# Patient Record
Sex: Female | Born: 1960 | Race: White | Hispanic: No | Marital: Single | Smoking: Former smoker
Health system: Southern US, Community
[De-identification: ages and names within clinical notes are randomized; demographics above are authoritative.]

## PROBLEM LIST (undated history)

## (undated) DIAGNOSIS — G4700 Insomnia, unspecified: Secondary | ICD-10-CM

## (undated) DIAGNOSIS — Z9889 Other specified postprocedural states: Secondary | ICD-10-CM

## (undated) DIAGNOSIS — F319 Bipolar disorder, unspecified: Secondary | ICD-10-CM

## (undated) DIAGNOSIS — N951 Menopausal and female climacteric states: Secondary | ICD-10-CM

## (undated) DIAGNOSIS — T8859XA Other complications of anesthesia, initial encounter: Secondary | ICD-10-CM

## (undated) DIAGNOSIS — R232 Flushing: Secondary | ICD-10-CM

## (undated) DIAGNOSIS — R011 Cardiac murmur, unspecified: Secondary | ICD-10-CM

## (undated) DIAGNOSIS — Z973 Presence of spectacles and contact lenses: Secondary | ICD-10-CM

## (undated) DIAGNOSIS — E785 Hyperlipidemia, unspecified: Secondary | ICD-10-CM

## (undated) DIAGNOSIS — T7840XA Allergy, unspecified, initial encounter: Secondary | ICD-10-CM

## (undated) DIAGNOSIS — K219 Gastro-esophageal reflux disease without esophagitis: Secondary | ICD-10-CM

## (undated) DIAGNOSIS — T4145XA Adverse effect of unspecified anesthetic, initial encounter: Secondary | ICD-10-CM

## (undated) DIAGNOSIS — F419 Anxiety disorder, unspecified: Secondary | ICD-10-CM

## (undated) DIAGNOSIS — R112 Nausea with vomiting, unspecified: Secondary | ICD-10-CM

## (undated) HISTORY — DX: Presence of spectacles and contact lenses: Z97.3

## (undated) HISTORY — DX: Flushing: R23.2

## (undated) HISTORY — DX: Allergy, unspecified, initial encounter: T78.40XA

## (undated) HISTORY — DX: Menopausal and female climacteric states: N95.1

## (undated) HISTORY — DX: Bipolar disorder, unspecified: F31.9

## (undated) HISTORY — DX: Anxiety disorder, unspecified: F41.9

## (undated) HISTORY — PX: EYE SURGERY: SHX253

## (undated) HISTORY — DX: Gastro-esophageal reflux disease without esophagitis: K21.9

## (undated) HISTORY — DX: Hyperlipidemia, unspecified: E78.5

## (undated) HISTORY — DX: Cardiac murmur, unspecified: R01.1

## (undated) HISTORY — DX: Insomnia, unspecified: G47.00

---

## 2008-04-23 ENCOUNTER — Ambulatory Visit (HOSPITAL_BASED_OUTPATIENT_CLINIC_OR_DEPARTMENT_OTHER): Admission: RE | Admit: 2008-04-23 | Discharge: 2008-04-23 | Payer: Self-pay | Admitting: Ophthalmology

## 2010-08-02 ENCOUNTER — Ambulatory Visit: Payer: Self-pay

## 2010-10-20 NOTE — Op Note (Signed)
Katherine Diaz, Katherine Diaz                  ACCOUNT NO.:  [id number]   MEDICAL RECORD NO.:  [id number]          PATIENT TYPE:  AMB   LOCATION:  DSC                          FACILITY:  [facility]   PHYSICIAN:  Pasty Spillers. Maple Hudson, M.D. DATE OF BIRTH:  [birth year]   DATE OF PROCEDURE:  06/23/2008  DATE OF DISCHARGE:  04/23/2008                               OPERATIVE REPORT   PREOPERATIVE DIAGNOSES:  1. 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  2. Left dissociated horizontal deviation.  3. History of previous eye muscle surgery.   POSTOPERATIVE DIAGNOSES:  1. 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  2. Left dissociated horizontal deviation.  3. History of previous eye muscle surgery.   PROCEDURE:  Left lateral rectus muscle recession, 8.5 mm, adjustable  technique.   SURGEON:  Pasty Spillers. Young, MD   ANESTHESIA:  General (laryngeal mask).   COMPLICATIONS:  None.   DESCRIPTION OF PROCEDURE:  After routine preop evaluation including  informed consent, the patient was taken to the operating room where she  was identified by me.  General anesthesia was induced without difficulty  after placement of appropriate monitors.  The patient was prepped and  draped in standard sterile fashion.  Lid speculum was placed in each  eye, and forced ductions were carried out.  Forced ductions were found  to be positive to attempted elevation of the right eye.  The speculum  was placed in the left eye.   A traction suture of 6-0 silk was placed at the superior and inferior  limbus, this was used to draw the left eye nasally.  A limbal  conjunctival peritomy of 2 clock hours' extent was made temporally in  left eye with Westcott scissors, with relaxing incisions in the  supratemporal and inferotemporal quadrants.  Moderate scarring was  encountered from previous eye muscle surgery.  The left lateral rectus  muscle was identified and carefully cleared of its surrounding fascial  attachments  and scar tissue.  Its tendon was secured with a double-armed  6-0 Vicryl suture, with a double-locking bite at each border of the  muscle.  The muscle was disinserted.  The pole sutures were passed back  into the original muscle stump in crossed-swords fashion, and the muscle  was drawn up to the level of the original insertion.  The 2 pole sutures  were tied together approximately 10 cm above sclera.  The pole sutures  were joined with a needle driver at a measured distance of 8.5 mm above  sclera, with the muscle pulled up to the original insertion.  A noose  suture was tied around the pole sutures at this location, and the muscle  was allowed to hang back 8.5 mm, until the noose rested on sclera.  The  superior corner of the conjunctival flap was secured to the adjacent  sclera with a 6-0 plain gut suture, leaving conjunctiva recessed  approximately 7 mm.  The superotemporal relaxing incision was also  closed with a 6-0 plain gut suture.  The inferior corner of conjunctival  flap was very loosely joined to the adjacent conjunctiva with a  very  large loop of 6-0 plain gut, which was left long to facilitate access to  the muscle during suture adjustment.  The traction suture was  repositioned at the temporal limbus.  TobraDex ointment was placed in  the eye.  The pole, noose, and traction sutures were taped to the left  cheek.  A soft patch was placed on the eye.  The patient was awakened  without difficulty and taken to the recovery room in stable condition,  having suffered no intraoperative or immediate postoperative  complications.      Pasty Spillers. Maple Hudson, M.D.  Electronically Signed     WOY/MEDQ  D:  06/23/2008  T:  06/24/2008  Job:  [id number]

## 2011-03-08 IMAGING — MG MM DIGITAL SCREENING BILAT W/ CAD
1 series · 4 of 4 positions shown · non-contrast
Comparison: none

REASON FOR EXAM: annual
COMMENTS:

PROCEDURE:     MAM - MAM [REDACTED] DIG SCREEN MAM W/CAD  - August 02, 2010  [DATE]
RESULT:     There are no prior mammograms for comparison.
No dominant mass or malignant appearing microcalcifications are seen. There
are scattered fibroglandular densities bilaterally.

[R CC · right · 4 of 4 slices shown]
[im 1/4]
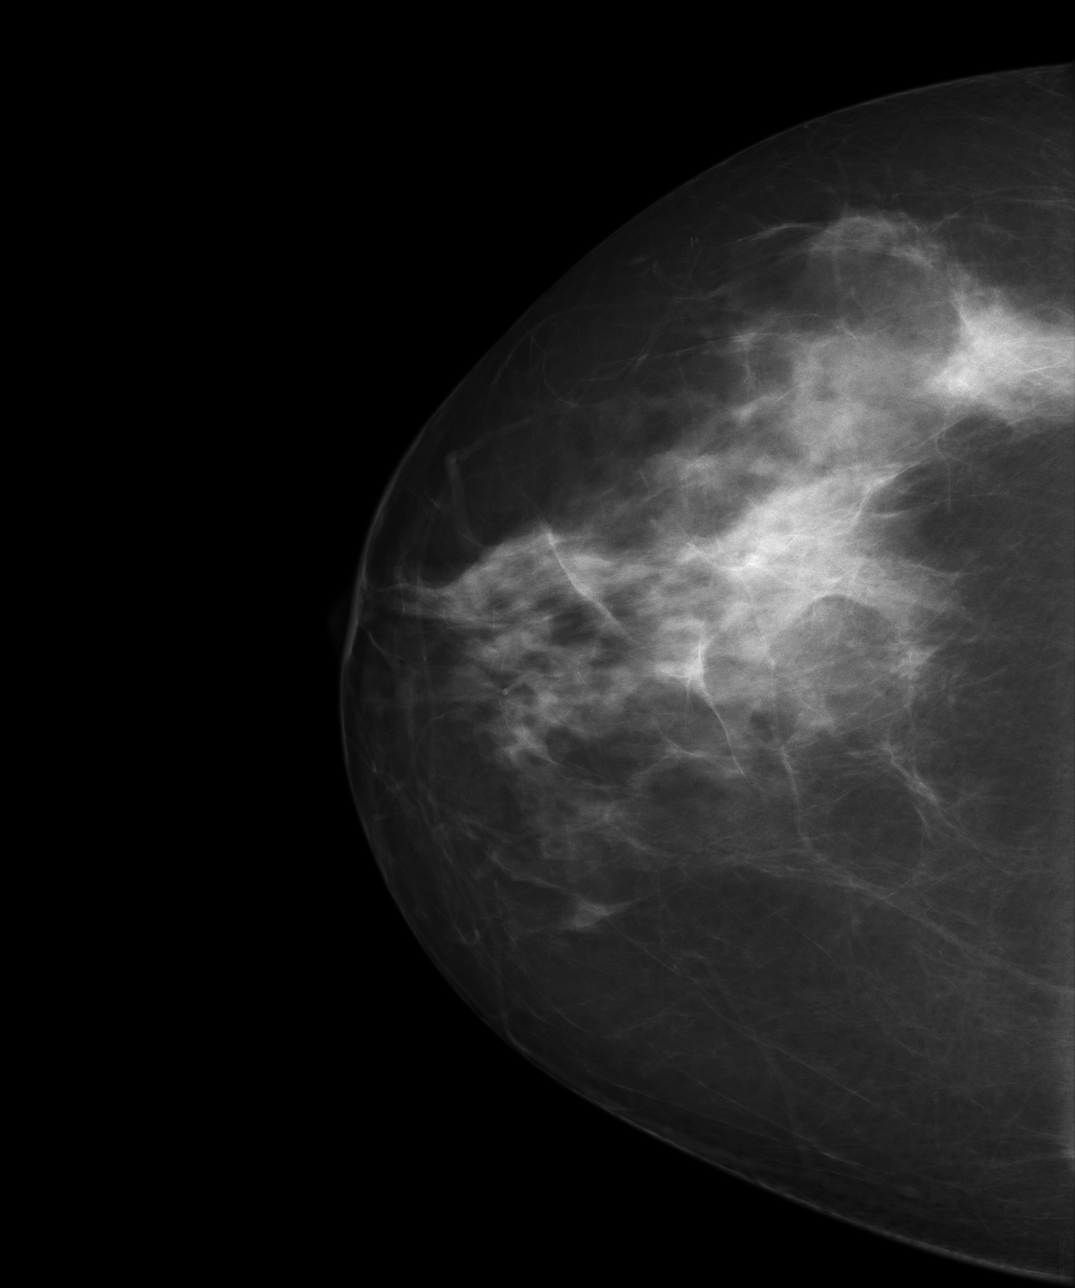
[im 2/4]
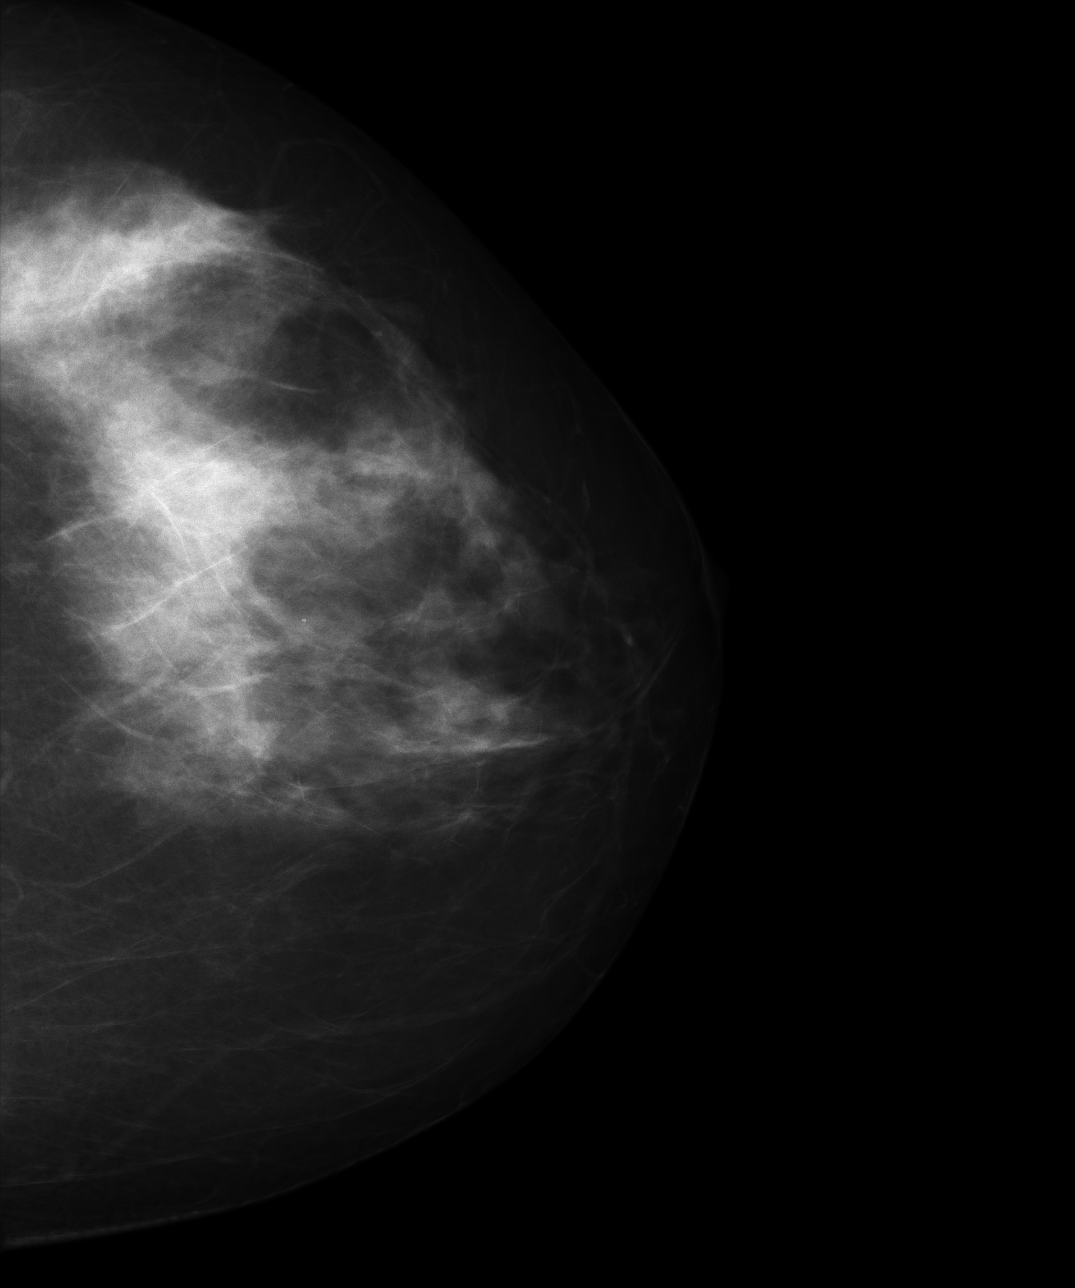
[im 3/4]
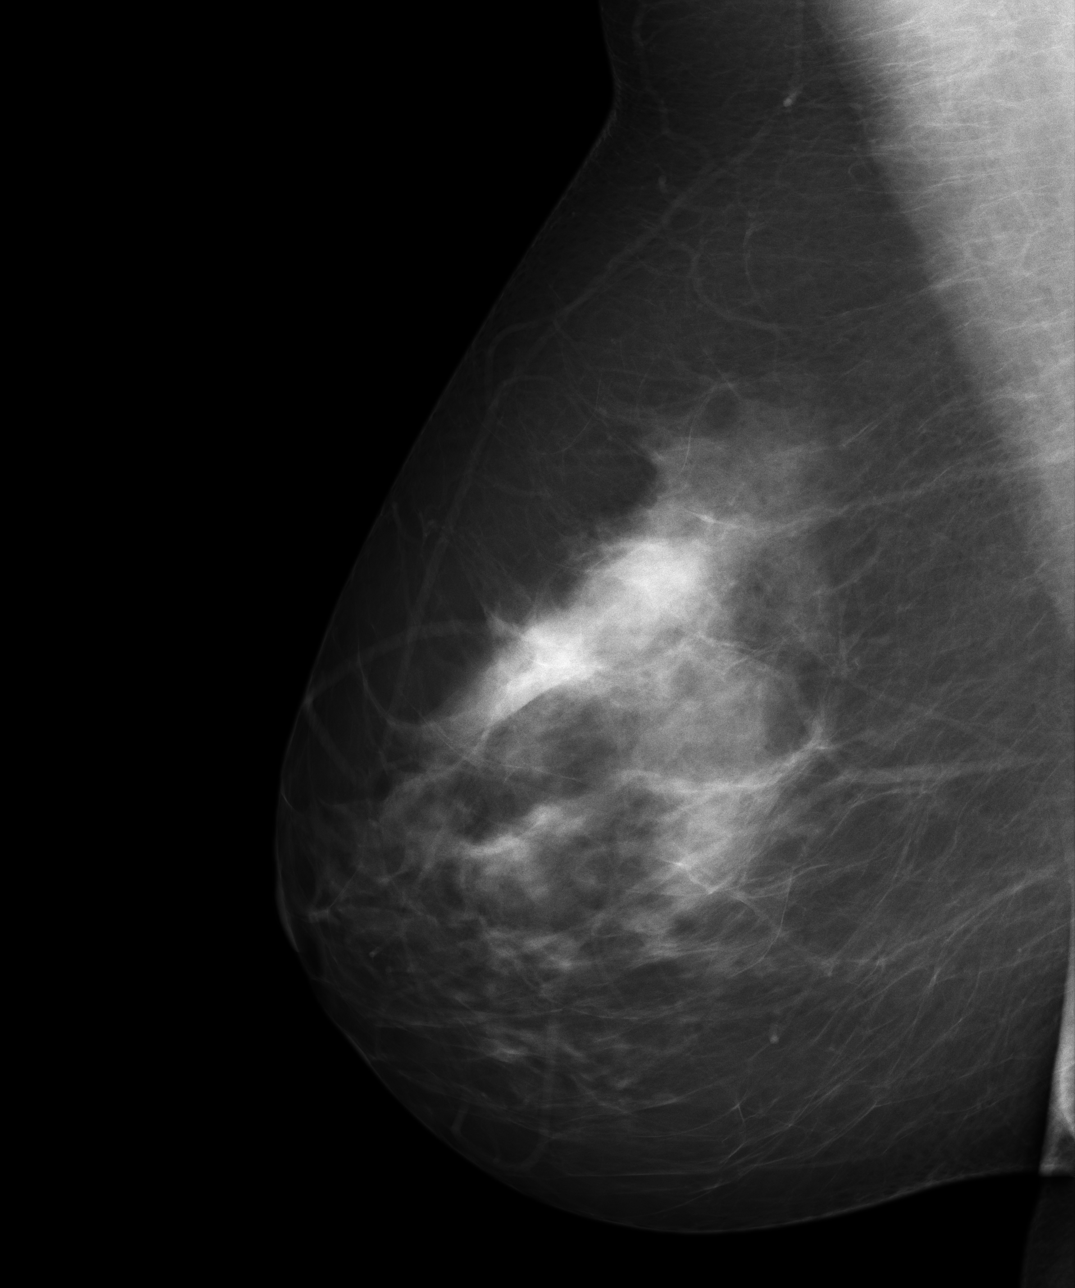
[im 4/4]
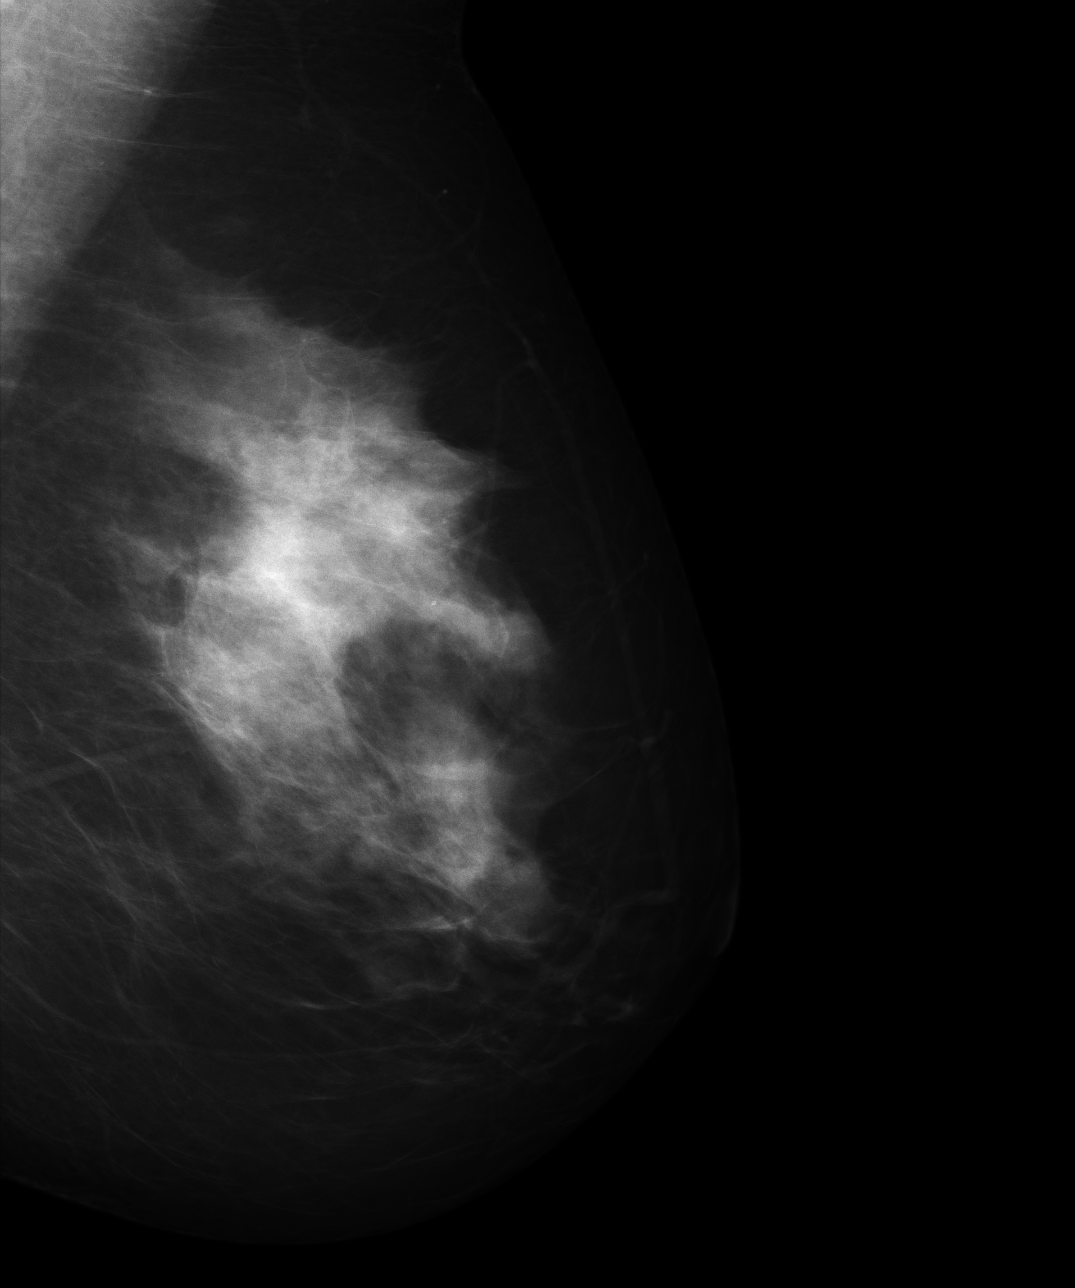

[4 of 4 positions shown; findings below may reference images not displayed]

IMPRESSION: 1.  Bilaterally benign appearing mammography.
2.  Annual mammography is recommended.
3.  BI-RADS: Category 1 Negative.

Thank you for this opportunity to contribute to the care of your patient.

A NEGATIVE MAMMOGRAM REPORT DOES NOT PRECLUDE BIOPSY OR OTHER EVALUATION OF
A CLINICALLY PALPABLE OR OTHERWISE SUSPICIOUS MASS OR LESION. BREAST CANCER
MAY NOT BE DETECTED BY MAMMOGRAPHY IN UP TO 10% OF CASES.

## 2014-05-10 ENCOUNTER — Emergency Department: Payer: Self-pay | Admitting: Emergency Medicine

## 2014-05-10 LAB — CBC WITH DIFFERENTIAL/PLATELET
Basophil #: 0 10*3/uL (ref 0.0–0.1)
Basophil %: 0.3 %
EOS ABS: 0.1 10*3/uL (ref 0.0–0.7)
Eosinophil %: 0.7 %
HCT: 42.3 % (ref 35.0–47.0)
HGB: 13.8 g/dL (ref 12.0–16.0)
LYMPHS ABS: 1.1 10*3/uL (ref 1.0–3.6)
Lymphocyte %: 8.2 %
MCH: 28.2 pg (ref 26.0–34.0)
MCHC: 32.7 g/dL (ref 32.0–36.0)
MCV: 86 fL (ref 80–100)
MONO ABS: 1 x10 3/mm — AB (ref 0.2–0.9)
Monocyte %: 7 %
Neutrophil #: 11.6 10*3/uL — ABNORMAL HIGH (ref 1.4–6.5)
Neutrophil %: 83.8 %
Platelet: 275 10*3/uL (ref 150–440)
RBC: 4.9 10*6/uL (ref 3.80–5.20)
RDW: 13.1 % (ref 11.5–14.5)
WBC: 13.9 10*3/uL — AB (ref 3.6–11.0)

## 2014-05-10 LAB — URINALYSIS, COMPLETE
BILIRUBIN, UR: NEGATIVE
BLOOD: NEGATIVE
Glucose,UR: NEGATIVE mg/dL (ref 0–75)
LEUKOCYTE ESTERASE: NEGATIVE
NITRITE: NEGATIVE
Ph: 5 (ref 4.5–8.0)
Protein: 30
RBC,UR: 20 /HPF (ref 0–5)
SPECIFIC GRAVITY: 1.029 (ref 1.003–1.030)
WBC UR: NONE SEEN /HPF (ref 0–5)

## 2014-05-10 LAB — COMPREHENSIVE METABOLIC PANEL
ALK PHOS: 65 U/L
ALT: 28 U/L
AST: 35 U/L (ref 15–37)
Albumin: 3.9 g/dL (ref 3.4–5.0)
Anion Gap: 9 (ref 7–16)
BILIRUBIN TOTAL: 0.4 mg/dL (ref 0.2–1.0)
BUN: 21 mg/dL — AB (ref 7–18)
CALCIUM: 8.9 mg/dL (ref 8.5–10.1)
CREATININE: 0.69 mg/dL (ref 0.60–1.30)
Chloride: 101 mmol/L (ref 98–107)
Co2: 28 mmol/L (ref 21–32)
EGFR (African American): >60
EGFR (Non-African Amer.): >60
Glucose: 100 mg/dL — ABNORMAL HIGH (ref 65–99)
OSMOLALITY: 279 (ref 275–301)
POTASSIUM: 3.4 mmol/L — AB (ref 3.5–5.1)
SODIUM: 138 mmol/L (ref 136–145)
TOTAL PROTEIN: 7.6 g/dL (ref 6.4–8.2)

## 2014-05-10 LAB — LIPASE, BLOOD: LIPASE: 96 U/L (ref 73–393)

## 2014-05-19 ENCOUNTER — Encounter (HOSPITAL_COMMUNITY): Payer: Self-pay | Admitting: Family Medicine

## 2014-05-19 ENCOUNTER — Emergency Department (HOSPITAL_COMMUNITY)
Admission: EM | Admit: 2014-05-19 | Discharge: 2014-05-19 | Disposition: A | Discharge status: Home / Self Care | Payer: BC Managed Care – PPO | Attending: Emergency Medicine | Admitting: Emergency Medicine

## 2014-05-19 DIAGNOSIS — Y9389 Activity, other specified: Secondary | ICD-10-CM | POA: Insufficient documentation

## 2014-05-19 DIAGNOSIS — Y998 Other external cause status: Secondary | ICD-10-CM | POA: Diagnosis not present

## 2014-05-19 DIAGNOSIS — S0003XA Contusion of scalp, initial encounter: Secondary | ICD-10-CM | POA: Insufficient documentation

## 2014-05-19 DIAGNOSIS — Z79899 Other long term (current) drug therapy: Secondary | ICD-10-CM | POA: Insufficient documentation

## 2014-05-19 DIAGNOSIS — S0990XA Unspecified injury of head, initial encounter: Secondary | ICD-10-CM

## 2014-05-19 DIAGNOSIS — Y92240 Courthouse as the place of occurrence of the external cause: Secondary | ICD-10-CM | POA: Insufficient documentation

## 2014-05-19 DIAGNOSIS — W01198A Fall on same level from slipping, tripping and stumbling with subsequent striking against other object, initial encounter: Secondary | ICD-10-CM | POA: Diagnosis not present

## 2014-05-19 MED ORDER — TRAMADOL HCL 50 MG PO TABS
50.0000 mg | ORAL_TABLET | Freq: Four times a day (QID) | ORAL | Status: DC | PRN
Start: 2014-05-19 — End: 2014-12-17

## 2014-05-19 MED ORDER — KETOROLAC TROMETHAMINE 30 MG/ML IJ SOLN
30.0000 mg | Freq: Once | INTRAMUSCULAR | Status: AC
  Administered 2014-05-19: 30 mg via INTRAVENOUS

## 2014-05-19 MED ORDER — KETOROLAC TROMETHAMINE 60 MG/2ML IM SOLN: 60.0000 mg | Freq: Once | INTRAMUSCULAR | Status: DC

## 2014-05-19 MED ORDER — KETOROLAC TROMETHAMINE 60 MG/2ML IM SOLN
30.0000 mg | Freq: Once | INTRAMUSCULAR | Status: DC
  Filled 2014-05-19: qty 2

## 2014-05-19 MED ORDER — NAPROXEN 500 MG PO TABS: 500.0000 mg | ORAL_TABLET | Freq: Two times a day (BID) | ORAL | Status: DC

## 2014-05-19 NOTE — ED Provider Notes (Signed)
CSN: [id number]     Arrival date & time 05/19/14  1119 History   First MD Initiated Contact with Patient 05/19/14 1330     Chief Complaint  Patient presents with  . Head Injury     (Consider location/radiation/quality/duration/timing/severity/associated sxs/prior Treatment) HPI Comments: 53 year old female, no significant past medical history presents after slipping on the steps [place] of [place] court house, falling and striking the back and top of her head. This was acute in onset, it occurred 3 hours prior to arrival, it was not associated with loss of consciousness, there has been no dizziness, lightheadedness, nausea, seizures, vomiting, blurred vision, numbness, weakness or difficulty with balance. She has had an ice pack to help with swelling, no medications, symptoms are consistent with only a headache. No prior head injury, no anticoagulation.  Patient is a 53 y.o. female presenting with head injury. The history is provided by the patient.  Head Injury   History reviewed. No pertinent past medical history. Past Surgical History  Procedure Laterality Date  . Cesarean section     History reviewed. No pertinent family history. History  Substance Use Topics  . Smoking status: Never Smoker   . Smokeless tobacco: Not on file  . Alcohol Use: No   OB History    No data available     Review of Systems  All other systems reviewed and are negative.     Allergies  Codeine  Home Medications   Prior to Admission medications   Medication Sig Start Date End Date Taking? Authorizing Provider  CRESTOR 10 MG tablet  05/11/14   Historical Provider, MD  LORazepam (ATIVAN) 1 MG tablet  03/01/14   Historical Provider, MD  naproxen (NAPROSYN) 500 MG tablet Take 1 tablet (500 mg total) by mouth 2 (two) times daily with a meal. 05/19/14   Vida RollerBrian D Sophy Mesler, MD  pantoprazole (PROTONIX) 40 MG tablet Take 40 mg by mouth daily. 04/24/14   Historical Provider, MD  promethazine (PHENERGAN) 12.5 MG  tablet  05/10/14   Historical Provider, MD  traMADol (ULTRAM) 50 MG tablet Take 1 tablet (50 mg total) by mouth every 6 (six) hours as needed. 05/19/14   Vida RollerBrian D Leo Weyandt, MD  traZODone (DESYREL) 50 MG tablet  05/18/14   Historical Provider, MD  venlafaxine (EFFEXOR) 75 MG tablet Take 75 mg by mouth daily. 03/14/14   Historical Provider, MD  venlafaxine XR (EFFEXOR-XR) 37.5 MG 24 hr capsule Take 37.5 mg by mouth daily. 03/15/14   Historical Provider, MD   BP 131/76 mmHg  Pulse 75  Temp(Src) 98.3 F (36.8 C)  Resp 18  Ht 5\' 7"  (1.702 m)  Wt 175 lb (79.379 kg)  BMI 27.40 kg/m2  SpO2 97% Physical Exam  Constitutional: She appears well-developed and well-nourished. No distress.  HENT:  Head: Normocephalic.  Mouth/Throat: Oropharynx is clear and moist. No oropharyngeal exudate.  Hematoma to the posterior occiput at the crown of the head, no hemotympanum, no malocclusion, no battle sign, no raccoon eyes  Eyes: Conjunctivae and EOM are normal. Pupils are equal, round, and reactive to light. Right eye exhibits no discharge. Left eye exhibits no discharge. No scleral icterus.  Neck: Normal range of motion. Neck supple. No JVD present. No thyromegaly present.  Cardiovascular: Normal rate, regular rhythm, normal heart sounds and intact distal pulses.  Exam reveals no gallop and no friction rub.   No murmur heard. Pulmonary/Chest: Effort normal and breath sounds normal. No respiratory distress. She has no wheezes. She has no rales.  Abdominal: Soft. Bowel sounds are normal. She exhibits no distension and no mass. There is no tenderness.  Musculoskeletal: Normal range of motion. She exhibits no edema or tenderness.  Lymphadenopathy:    She has no cervical adenopathy.  Neurological: She is alert. Coordination normal.  Speech is clear, cranial nerves III through XII are intact, memory is intact, strength is normal in all 4 extremities including grips, sensation is intact to light touch and pinprick in  all 4 extremities. Coordination as tested by finger-nose-finger is normal, no limb ataxia. Normal gait, normal reflexes at the patellar tendons bilaterally  Skin: Skin is warm and dry. No rash noted. No erythema.  Psychiatric: She has a normal mood and affect. Her behavior is normal.  Nursing note and vitals reviewed.   ED Course  Procedures (including critical care time) Labs Review Labs Reviewed - No data to display  Imaging Review No results found.   MDM   Final diagnoses:  Head injury  Hematoma of scalp, initial encounter    No other signs of injury, isolated hematoma, patient has a normal mental status and neurologic exam, no nausea, 3 hours out from accident, she declines CT scan, requests anti-inflammatory medication and states that she has to get back to work. Will prescribe medications as below.  She is aware of the indications for return   Meds given in ED:  Medications  ketorolac (TORADOL) injection 60 mg (not administered)    New Prescriptions   NAPROXEN (NAPROSYN) 500 MG TABLET    Take 1 tablet (500 mg total) by mouth 2 (two) times daily with a meal.   TRAMADOL (ULTRAM) 50 MG TABLET    Take 1 tablet (50 mg total) by mouth every 6 (six) hours as needed.        Vida RollerBrian D Newton Frutiger, MD 05/19/14 [phone]

## 2014-05-19 NOTE — Discharge Instructions (Signed)
Please call your doctor for a followup appointment within 24-48 hours. When you talk to your doctor please let them know that you were seen in the emergency department and have them acquire all of your records so that they can discuss the findings with you and formulate a treatment plan to fully care for your new and ongoing problems. ° °

## 2014-05-19 NOTE — ED Notes (Signed)
Went into room introduced myself and instructed the pt that we needed her to place on a gown so she can be placed on a monitor and the pt stated "No I'm not taking off my clothes. Not putting on a gown or anything else. I don't need it, all I have is a bump on my head and that's it"; Ed, RN and Hyacinth MeekerMiller, MD going to speak with pt now

## 2014-05-19 NOTE — ED Notes (Signed)
Per pt sts she tripped and fell on the stairs [place] the court house. sts that she hit head and is having head pain, nausea and hematoma to the back of head. Denies blood thinners.

## 2014-05-19 NOTE — ED Notes (Signed)
Pt brought to room from triage

## 2014-12-05 ENCOUNTER — Other Ambulatory Visit: Payer: Self-pay | Admitting: Family Medicine

## 2014-12-17 ENCOUNTER — Encounter: Payer: Self-pay | Admitting: Family Medicine

## 2014-12-17 ENCOUNTER — Encounter (INDEPENDENT_AMBULATORY_CARE_PROVIDER_SITE_OTHER): Payer: Self-pay

## 2014-12-17 ENCOUNTER — Ambulatory Visit (INDEPENDENT_AMBULATORY_CARE_PROVIDER_SITE_OTHER): Payer: BLUE CROSS/BLUE SHIELD | Admitting: Family Medicine

## 2014-12-17 VITALS — BP 126/74 | HR 68 | Temp 98.0°F | Resp 16 | Ht 65.0 in | Wt 185.7 lb

## 2014-12-17 DIAGNOSIS — R5383 Other fatigue: Secondary | ICD-10-CM | POA: Insufficient documentation

## 2014-12-17 DIAGNOSIS — N951 Menopausal and female climacteric states: Secondary | ICD-10-CM | POA: Insufficient documentation

## 2014-12-17 DIAGNOSIS — F411 Generalized anxiety disorder: Secondary | ICD-10-CM

## 2014-12-17 DIAGNOSIS — E785 Hyperlipidemia, unspecified: Secondary | ICD-10-CM

## 2014-12-17 DIAGNOSIS — G4700 Insomnia, unspecified: Secondary | ICD-10-CM | POA: Insufficient documentation

## 2014-12-17 DIAGNOSIS — K219 Gastro-esophageal reflux disease without esophagitis: Secondary | ICD-10-CM | POA: Insufficient documentation

## 2014-12-17 DIAGNOSIS — F3160 Bipolar disorder, current episode mixed, unspecified: Secondary | ICD-10-CM | POA: Insufficient documentation

## 2014-12-17 DIAGNOSIS — L720 Epidermal cyst: Secondary | ICD-10-CM | POA: Insufficient documentation

## 2014-12-17 MED ORDER — CLONAZEPAM 1 MG PO TABS: 1.0000 mg | ORAL_TABLET | Freq: Two times a day (BID) | ORAL | Status: DC | PRN

## 2014-12-17 NOTE — Progress Notes (Signed)
Name: Katherine Diaz   MRN: [id number]    DOB: 13-Sep-1960   Date:12/17/2014       Progress Note  Subjective  Chief Complaint  Chief Complaint  Patient presents with  . Labs Only    to check cholesterol. patient is interested in doing the colorguard.  . Anxiety  . Medication Refill    HPI  Anxiety: Patient complains of anxiety disorder, panic attacks and sleep disturbance.  She has the following symptoms: difficulty concentrating, fatigue, feelings of losing control, insomnia, irritable, palpitations, psychomotor agitation, racing thoughts. Onset of symptoms was approximately several years ago, stable since that time. She denies current suicidal and homicidal ideation. Family history significant for anxiety and depression.Possible organic causes contributing are: medications, endocrine/metabolic. Risk factors: previous episode of depression Previous treatment includes Venlafaxine, benzodiazepines Ativan, Xanax, more recently clonazepam and medication.  She complains of the following side effects from the treatment: none. Her anxiety is not well controled and she has made an appointment with a psychiatrist but it will not be until Dec 2016.   Hyperlipidemia: Patient presents with hyperlipidemia.  She was tested because she has had previously elevated cholesterol. She denies cardiac symptoms such as chest pain, shortness of breath with rest or exertions, palpitations, dizziness, claudication. There is a family history of hyperlipidemia. There is not a family history of early ischemia heart disease. She has previously been tried on a few types of statins such as Crestor  and Atorvastatin  but everything caused arthralgia and myalgia and memory changes.        Patient Active Problem List   Diagnosis Date Noted  . Anxiety disorder 12/17/2014  . Gastro-esophageal reflux disease without esophagitis 12/17/2014  . Hot flash, menopausal 12/17/2014  . Hyperlipidemia LDL goal <100 12/17/2014   . Insomnia, uncontrolled 12/17/2014  . Atheroma, skin 12/17/2014    History  Substance Use Topics  . Smoking status: Former Games developer  . Smokeless tobacco: Not on file  . Alcohol Use: No     Current outpatient prescriptions:  .  clonazePAM (KLONOPIN) 1 MG tablet, Take by mouth., Disp: , Rfl:  .  MIMVEY 1-0.5 MG per tablet, TAKE 1 TABLET BY MOUTH EVERY DAY FOR 28 DAYS, Disp: , Rfl: 2 .  pantoprazole (PROTONIX) 40 MG tablet, Take by mouth., Disp: , Rfl:  .  traZODone (DESYREL) 50 MG tablet, Take by mouth., Disp: , Rfl:  .  venlafaxine (EFFEXOR) 75 MG tablet, Take 75 mg by mouth daily., Disp: , Rfl: 2 .  venlafaxine XR (EFFEXOR-XR) 37.5 MG 24 hr capsule, Take 37.5 mg by mouth daily., Disp: , Rfl: 3  Past Surgical History  Procedure Laterality Date  . Cesarean section    . Eye surgery      amblyopia    Family History  Problem Relation Age of Onset  . Heart disease Paternal Grandmother   . Hypertension Paternal Grandmother     Allergies  Allergen Reactions  . Atorvastatin   . Codeine Nausea And Vomiting     Review of Systems  CONSTITUTIONAL: No significant weight changes, fever, chills, weakness or fatigue.  HEENT:  - Eyes: No visual changes.  - Ears: No auditory changes. No pain.  - Nose: No sneezing, congestion, runny nose. - Throat: No sore throat. No changes in swallowing. SKIN: No rash or itching.  CARDIOVASCULAR: No chest pain, chest pressure or chest discomfort. No palpitations or edema.  RESPIRATORY: No shortness of breath, cough or sputum.  GASTROINTESTINAL: No anorexia, nausea, vomiting. No  changes in bowel habits. No abdominal pain or blood.  GENITOURINARY: No dysuria. No frequency. No discharge. NEUROLOGICAL: No headache, dizziness, syncope, paralysis, ataxia, numbness or tingling in the extremities. No memory changes. No change in bowel or bladder control.  MUSCULOSKELETAL: No joint pain. No muscle pain. HEMATOLOGIC: No anemia, bleeding or bruising.   LYMPHATICS: No enlarged lymph nodes.  PSYCHIATRIC: No change in mood. No change in sleep pattern.  ENDOCRINOLOGIC: No reports of sweating, cold or heat intolerance. No polyuria or polydipsia.      Objective  BP 126/74 mmHg  Pulse 68  Temp(Src) 98 F (36.7 C) (Oral)  Resp 16  Ht 5\' 5"  (1.651 m)  Wt 185 lb 11.2 oz (84.233 kg)  BMI 30.90 kg/m2  SpO2 97% Body mass index is 30.9 kg/(m^2).  Physical Exam  Constitutional: Patient is obese and well-nourished. In no distress.  HEENT:  - Head: Normocephalic and atraumatic.  - Ears: Bilateral TMs gray, no erythema or effusion - Nose: Nasal mucosa moist - Mouth/Throat: Oropharynx is clear and moist. No tonsillar hypertrophy or erythema. No post nasal drainage.  - Eyes: Conjunctivae clear, EOM movements normal. PERRLA. No scleral icterus.  Neck: Normal range of motion. Neck supple. No JVD present. No thyromegaly present.  Cardiovascular: Normal rate, regular rhythm and normal heart sounds.  No murmur heard.  Pulmonary/Chest: Effort normal and breath sounds normal. No respiratory distress. Musculoskeletal: Normal range of motion bilateral UE and LE, no joint effusions. Peripheral vascular: Bilateral LE no edema. Neurological: CN II-XII grossly intact with no focal deficits. Alert and oriented to person, place, and time. Coordination, balance, strength, speech and gait are normal.  Skin: Skin is warm and dry. No rash noted. No erythema.  Psychiatric: Patient has a stable mood and affect. Behavior is normal in office today. Judgment and thought content normal in office today.   Assessment & Plan  1. Generalized anxiety disorder Continue Venlafaxine total 112mg  XR po qday and clonazepam 1mg  po up to twice a day. She was not interested in increasing Venlafaxine dose as she felt that it worsened her depression symptoms. Ms Katherine Diaz will establish with the psychiatry specialist in December.  - clonazePAM (KLONOPIN) 1 MG tablet; Take 1 tablet  (1 mg total) by mouth 2 (two) times daily as needed for anxiety.  Dispense: 60 tablet; Refill: 5 - TSH  2. Hyperlipidemia LDL goal <100 Due to recheck labs. Consider Vascepa, Tricor, Vytorin as alternative options.  - Lipid panel - TSH  3. Other fatigue Lab work.  - CBC with Differential/Platelet - Comprehensive metabolic panel - TSH

## 2014-12-20 ENCOUNTER — Other Ambulatory Visit: Payer: Self-pay

## 2014-12-20 DIAGNOSIS — G4700 Insomnia, unspecified: Secondary | ICD-10-CM

## 2014-12-20 MED ORDER — TRAZODONE HCL 50 MG PO TABS: 50.0000 mg | ORAL_TABLET | Freq: Every day | ORAL | Status: DC

## 2014-12-20 NOTE — Telephone Encounter (Signed)
Refill request was sent to Dr. Ashany Sundaram for approval and submission.  

## 2014-12-24 ENCOUNTER — Other Ambulatory Visit: Payer: Self-pay | Admitting: Family Medicine

## 2014-12-24 DIAGNOSIS — G4700 Insomnia, unspecified: Secondary | ICD-10-CM

## 2014-12-24 MED ORDER — TRAZODONE HCL 150 MG PO TABS: 150.0000 mg | ORAL_TABLET | Freq: Every day | ORAL | Status: DC

## 2014-12-25 LAB — COMPREHENSIVE METABOLIC PANEL
A/G RATIO: 1.8 (ref 1.1–2.5)
ALBUMIN: 4.2 g/dL (ref 3.5–5.5)
ALK PHOS: 57 IU/L (ref 39–117)
ALT: 15 IU/L (ref 0–32)
AST: 14 IU/L (ref 0–40)
BILIRUBIN TOTAL: 0.2 mg/dL (ref 0.0–1.2)
BUN / CREAT RATIO: 21 (ref 9–23)
BUN: 13 mg/dL (ref 6–24)
CO2: 25 mmol/L (ref 18–29)
CREATININE: 0.63 mg/dL (ref 0.57–1.00)
Calcium: 9.4 mg/dL (ref 8.7–10.2)
Chloride: 98 mmol/L (ref 97–108)
GFR, EST AFRICAN AMERICAN: 119 mL/min/{1.73_m2} (ref >59)
GFR, EST NON AFRICAN AMERICAN: 103 mL/min/{1.73_m2} (ref >59)
GLOBULIN, TOTAL: 2.3 g/dL (ref 1.5–4.5)
Glucose: 94 mg/dL (ref 65–99)
Potassium: 4.4 mmol/L (ref 3.5–5.2)
Sodium: 138 mmol/L (ref 134–144)
TOTAL PROTEIN: 6.5 g/dL (ref 6.0–8.5)

## 2014-12-25 LAB — CBC WITH DIFFERENTIAL/PLATELET
Basophils Absolute: 0 10*3/uL (ref 0.0–0.2)
Basos: 0 %
EOS (ABSOLUTE): 0.1 10*3/uL (ref 0.0–0.4)
EOS: 1 %
HEMATOCRIT: 38.3 % (ref 34.0–46.6)
Hemoglobin: 12.6 g/dL (ref 11.1–15.9)
IMMATURE GRANULOCYTES: 0 %
Immature Grans (Abs): 0 10*3/uL (ref 0.0–0.1)
LYMPHS ABS: 2.4 10*3/uL (ref 0.7–3.1)
LYMPHS: 31 %
MCH: 28.3 pg (ref 26.6–33.0)
MCHC: 32.9 g/dL (ref 31.5–35.7)
MCV: 86 fL (ref 79–97)
MONOCYTES: 7 %
Monocytes Absolute: 0.5 10*3/uL (ref 0.1–0.9)
NEUTROS ABS: 4.7 10*3/uL (ref 1.4–7.0)
Neutrophils: 61 %
PLATELETS: 282 10*3/uL (ref 150–379)
RBC: 4.45 x10E6/uL (ref 3.77–5.28)
RDW: 13 % (ref 12.3–15.4)
WBC: 7.8 10*3/uL (ref 3.4–10.8)

## 2014-12-25 LAB — LIPID PANEL
CHOL/HDL RATIO: 4.1 ratio (ref 0.0–4.4)
CHOLESTEROL TOTAL: 253 mg/dL — AB (ref 100–199)
HDL: 61 mg/dL (ref >39)
LDL CALC: 171 mg/dL — AB (ref 0–99)
TRIGLYCERIDES: 104 mg/dL (ref 0–149)
VLDL CHOLESTEROL CAL: 21 mg/dL (ref 5–40)

## 2014-12-25 LAB — TSH: TSH: 0.664 u[IU]/mL (ref 0.450–4.500)

## 2014-12-27 ENCOUNTER — Other Ambulatory Visit: Payer: Self-pay

## 2014-12-27 DIAGNOSIS — E785 Hyperlipidemia, unspecified: Secondary | ICD-10-CM

## 2014-12-27 MED ORDER — PITAVASTATIN CALCIUM 2 MG PO TABS: 2.0000 mg | ORAL_TABLET | Freq: Every day | ORAL | Status: DC

## 2014-12-27 NOTE — Telephone Encounter (Signed)
Per the request of Dr. Sherley Bounds and the permission of this patient a new rx was sent to [facility] on [address] for her to try that will hopefully lower her cholesterol. Patient was informed.

## 2015-01-11 LAB — HM MAMMOGRAPHY

## 2015-01-13 ENCOUNTER — Telehealth: Payer: Self-pay | Admitting: Family Medicine

## 2015-01-13 NOTE — Telephone Encounter (Signed)
I have been aware of her previous intolerance to statin medications to help lower her cholesterol. That is why on 12/29/14 I prescribed Livalo a new medication designed to help patients who get muscle aches and pains and fatigue with typical statin medications. But if this medication is also causing her to feel this way there she is welcome to try taking the medication every other day to see if it helps reduce her symptoms. If not she can stop using it and will have to control her diet as best as possible with diet and exercise as I am not aware of any other prescription options at this time.

## 2015-01-13 NOTE — Telephone Encounter (Signed)
Pt said that she can not take her cholesterol meds. It makes her feel like she has been run over by a train. She is in pain and had to take Motrain 2 times yesterday for the pain. She has tried several different ones and she can not take any of them and needs to know what to do.

## 2015-01-13 NOTE — Telephone Encounter (Signed)
Patient was informed of the message below and stated, "ok, I just feel like an old lady when I take them." Patient was encouraged to do as directed with medication, diet and exercise and hopefully there will be a decrease with her cholesterol and she will not have to continue to taking the medication. She said ok and thanks.

## 2015-02-15 ENCOUNTER — Ambulatory Visit (INDEPENDENT_AMBULATORY_CARE_PROVIDER_SITE_OTHER): Payer: BLUE CROSS/BLUE SHIELD | Admitting: Family Medicine

## 2015-02-15 ENCOUNTER — Encounter: Payer: Self-pay | Admitting: Family Medicine

## 2015-02-15 VITALS — BP 112/72 | HR 89 | Temp 98.6°F | Resp 16 | Wt 184.9 lb

## 2015-02-15 DIAGNOSIS — G44009 Cluster headache syndrome, unspecified, not intractable: Secondary | ICD-10-CM

## 2015-02-15 DIAGNOSIS — F411 Generalized anxiety disorder: Secondary | ICD-10-CM

## 2015-02-15 DIAGNOSIS — J45909 Unspecified asthma, uncomplicated: Secondary | ICD-10-CM | POA: Diagnosis not present

## 2015-02-15 DIAGNOSIS — N951 Menopausal and female climacteric states: Secondary | ICD-10-CM

## 2015-02-15 DIAGNOSIS — R0982 Postnasal drip: Secondary | ICD-10-CM

## 2015-02-15 DIAGNOSIS — J309 Allergic rhinitis, unspecified: Secondary | ICD-10-CM

## 2015-02-15 MED ORDER — SUMATRIPTAN SUCCINATE 100 MG PO TABS: ORAL_TABLET | ORAL | Status: DC

## 2015-02-15 MED ORDER — PREDNISONE 10 MG PO TABS: 10.0000 mg | ORAL_TABLET | Freq: Two times a day (BID) | ORAL | Status: DC

## 2015-02-15 NOTE — Patient Instructions (Signed)
Cluster Headache Cluster headaches are recognized by their pattern of deep, intense head pain. They normally occur on one side of your head, but they may "switch sides" in subsequent episodes. Typically, cluster headaches:   Are severe in nature.   Occur repeatedly over weeks to months and are followed by periods of no headaches.   Can last from 15 minutes to 3 hours.   Occur at the same time each day, often at night.   Occur several times a day. CAUSES The exact cause of cluster headaches is not known. Alcohol use may be associated with cluster headaches. SIGNS AND SYMPTOMS   Severe pain that begins in or around your eye or temple.   One-sided head pain.   Feeling sick to your stomach (nauseous).   Sensitivity to light.   Runny nose.   Eye redness, tearing, and nasal stuffiness on the side of your head where you are experiencing pain.   Sweaty, pale skin of the face.   Droopy or swollen eyelid.   Restlessness. DIAGNOSIS  Cluster headaches are diagnosed based on symptoms and a physical exam. Your health care provider may order a CT scan or an MRI of your head or lab tests to see if your headaches are caused by other medical conditions.  TREATMENT   Medicines for pain relief and to prevent recurrent attacks. Some people may need a combination of medicines.  Oxygen for pain relief.   Biofeedback programs to help reduce headache pain.  It may be helpful to keep a headache diary. This may help you find a trend for what is triggering your headaches. Your health care provider can develop a treatment plan.  HOME CARE INSTRUCTIONS  During cluster periods:   Follow a regular sleep schedule. Do not vary the amount and time that you sleep from day to day. It is important to stay on the same schedule during a cluster period to help prevent headaches.   Avoid alcohol.   Stop smoking if you smoke.  SEEK MEDICAL CARE IF:  You have any changes from your previous  cluster headaches either in intensity or frequency.   You are not getting relief from medicines you are taking.  SEEK IMMEDIATE MEDICAL CARE IF:   You faint.   You have weakness or numbness, especially on one side of your body or face.   You have double vision.   You have nausea or vomiting that is not relieved within several hours.   You cannot keep your balance or have difficulty talking or walking.   You have neck pain or stiffness.   You have a fever. MAKE SURE YOU:  Understand these instructions.   Will watch your condition.   Will get help right away if you are not doing well or get worse. Document Released: 05/21/2005 Document Revised: 03/11/2013 Document Reviewed: 12/11/2012 [employer] Patient Information 2015 [employer]. This information is not intended to replace advice given to you by your health care provider. Make sure you discuss any questions you have with your health care provider.  

## 2015-02-15 NOTE — Progress Notes (Signed)
Name: Katherine Diaz   MRN: [id number]    DOB: 05-06-1961   Date:02/15/2015       Progress Note  Subjective  Chief Complaint  Chief Complaint  Patient presents with  . Migraine    Patient has been having some headaches that come from 2:30-6:30am. When it happens she has sensitivity to light and noise. Patient stated that it happens for 3-4 days then it goes away and comes back for about 2 days.    HPI  Headache: Patient presents today to discuss recent headaches. Symptoms began about a few months ago. Generally, the headaches last about 1-3 hrs and occur nightly from 2 am to 6 am sudden onset for about 3-4 nights in a row then nothing for 2-3 nights and the cycle starts again. The headaches are usually sharp, squeezing and throbbing and are frontal in location, almost as if they are behind her face or eyes.  The patient rates her most severe headaches a 10 on a scale from 1 to 10. Recently, the headaches are increasing in both severity and frequency. Work attendance or other daily activities are not affected by the headaches as they do not occur during the day. Precipitating factors may include caffeine withdrawal, stress, URI symptoms, sinus congestion and weather changes. The headaches are usually not preceded by an aura. Associated neurologic symptoms which are present include: depression and anxiety. The patient denies dizziness, loss of balance, muscle weakness, numbness of extremities, speech difficulties, vision problems and vomiting in the early morning. Other associated symptoms include: fatigue and irritability.  Symptoms which are not present include: chest pain, conjunctivitis, cough, earache, fever, rash, sore throat, vomiting and wheezing. Home treatment has included acetaminophen and ibuprofen, darkening the room, resting and sleeping with adequate improvement but she is concerned about the re-occurance. Other history includes: tension and sinus headaches diagnosed in the past. Family  history includes migraine headaches in parents.  Anxiety: Patient complains of anxiety disorder, panic attacks and sleep disturbance. She has the following symptoms: difficulty concentrating, fatigue, feelings of losing control, insomnia, irritable, palpitations, psychomotor agitation, racing thoughts. Onset of symptoms was approximately several years ago, stable since that time but recently feeling like her symptoms are not well controlled. 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 Family history significant for anxiety and depression.Possible organic causes contributing are: medications, endocrine/metabolic. Risk factors: previous episode of depression.  Previous treatment includes Venlafaxine, benzodiazepines Ativan, Xanax, more recently clonazepam and medication. She complains of the following side effects from the treatment: none. Her anxiety is not well controled and she has made an appointment with a psychiatrist but it will not be until Dec 2016. She is asking me to refer her to someone sooner if possible.    Patient Active Problem List   Diagnosis Date Noted  . Anxiety disorder 12/17/2014  . Gastro-esophageal reflux disease without esophagitis 12/17/2014  . Hot flash, menopausal 12/17/2014  . Hyperlipidemia LDL goal <100 12/17/2014  . Insomnia, uncontrolled 12/17/2014  . Atheroma, skin 12/17/2014  . Other fatigue 12/17/2014    Social History  Substance Use Topics  . Smoking status: Former Games developer  . Smokeless tobacco: Not on file  . Alcohol Use: No     Current outpatient prescriptions:  .  clonazePAM (KLONOPIN) 1 MG tablet, Take 1 tablet (1 mg total) by mouth 2 (two) times daily as needed for anxiety., Disp: 60 tablet, Rfl: 5 .  MIMVEY 1-0.5 MG per tablet, TAKE 1 TABLET BY  MOUTH EVERY DAY FOR 28 DAYS, Disp: , Rfl: 2 .  pantoprazole (PROTONIX) 40 MG tablet, Take by mouth., Disp: , Rfl:  .  Pitavastatin Calcium (LIVALO) 2 MG TABS, Take 1  tablet (2 mg total) by mouth at bedtime., Disp: 30 tablet, Rfl: 3 .  traZODone (DESYREL) 150 MG tablet, Take 1 tablet (150 mg total) by mouth at bedtime., Disp: 90 tablet, Rfl: 3 .  venlafaxine (EFFEXOR) 75 MG tablet, Take 75 mg by mouth daily., Disp: , Rfl: 2 .  venlafaxine XR (EFFEXOR-XR) 37.5 MG 24 hr capsule, Take 37.5 mg by mouth daily., Disp: , Rfl: 3  Past Surgical History  Procedure Laterality Date  . Cesarean section    . Eye surgery      amblyopia    Family History  Problem Relation Age of Onset  . Heart disease Paternal Grandmother   . Hypertension Paternal Grandmother     Allergies  Allergen Reactions  . Atorvastatin   . Codeine Nausea And Vomiting     Review of Systems  CONSTITUTIONAL: No significant weight changes, fever, chills, weakness or fatigue.  HEENT:  - Eyes: No visual changes.  - Ears: No auditory changes. No pain.  - Nose: No sneezing, congestion, runny nose. - Throat: No sore throat. No changes in swallowing. SKIN: No rash or itching.  CARDIOVASCULAR: No chest pain, chest pressure or chest discomfort. No palpitations or edema.  RESPIRATORY: No shortness of breath, cough or sputum.  GASTROINTESTINAL: No anorexia, nausea, vomiting. No changes in bowel habits. No abdominal pain or blood.  GENITOURINARY: No dysuria. No frequency. No discharge. NEUROLOGICAL: Yes headache. No dizziness, syncope, paralysis, ataxia, numbness or tingling in the extremities. No memory changes. No change in bowel or bladder control.  MUSCULOSKELETAL: No joint pain. No muscle pain. HEMATOLOGIC: No anemia, bleeding or bruising.  LYMPHATICS: No enlarged lymph nodes.  PSYCHIATRIC: Yes change in mood. No change in sleep pattern.  ENDOCRINOLOGIC: No reports of sweating, cold or heat intolerance. No polyuria or polydipsia.     Objective  BP 112/72 mmHg  Pulse 89  Temp(Src) 98.6 F (37 C) (Oral)  Resp 16  Wt 184 lb 14.4 oz (83.87 kg)  SpO2 96% Body mass index is 30.77  kg/(m^2).  Physical Exam  Constitutional: Patient is overweight and well-nourished. In no distress.  HEENT:  - Head: Normocephalic and atraumatic.  - Ears: Bilateral TMs gray, no erythema or effusion - Nose: Nasal mucosa moist, boggy and congested. - Mouth/Throat: Oropharynx is clear and moist. No tonsillar hypertrophy or erythema. Some post nasal drainage.  - Eyes: Conjunctivae clear, EOM movements normal. PERRLA. No scleral icterus. Fundoscopic exam benign.  Neck: Normal range of motion. Neck supple. No JVD present. No thyromegaly present.  Cardiovascular: Normal rate, regular rhythm and normal heart sounds.  No murmur heard.  Pulmonary/Chest: Effort normal and breath sounds normal. No respiratory distress. Musculoskeletal: Normal range of motion bilateral UE and LE, no joint effusions. Peripheral vascular: Bilateral LE no edema. Neurological: CN II-XII grossly intact with no focal deficits. Alert and oriented to person, place, and time. Coordination, balance, strength, speech and gait are normal.  Skin: Skin is warm and dry. No rash noted. No erythema.  Psychiatric: Patient has a stable mood and affect. Behavior is normal in office today. Judgment and thought content normal in office today.   Recent Results (from the past 2160 hour(s))  CBC with Differential/Platelet     Status: None   Collection Time: 12/24/14  8:54 AM  Result  Value Ref Range   WBC 7.8 3.4 - 10.8 x10E3/uL   RBC 4.45 3.77 - 5.28 x10E6/uL   Hemoglobin 12.6 11.1 - 15.9 g/dL   Hematocrit 91.4 78.2 - 46.6 %   MCV 86 79 - 97 fL   MCH 28.3 26.6 - 33.0 pg   MCHC 32.9 31.5 - 35.7 g/dL   RDW 95.6 21.3 - 08.6 %   Platelets 282 150 - 379 x10E3/uL   Neutrophils 61 %   Lymphs 31 %   Monocytes 7 %   Eos 1 %   Basos 0 %   Neutrophils Absolute 4.7 1.4 - 7.0 x10E3/uL   Lymphocytes Absolute 2.4 0.7 - 3.1 x10E3/uL   Monocytes Absolute 0.5 0.1 - 0.9 x10E3/uL   EOS (ABSOLUTE) 0.1 0.0 - 0.4 x10E3/uL   Basophils Absolute 0.0  0.0 - 0.2 x10E3/uL   Immature Granulocytes 0 %   Immature Grans (Abs) 0.0 0.0 - 0.1 x10E3/uL  Comprehensive metabolic panel     Status: None   Collection Time: 12/24/14  8:54 AM  Result Value Ref Range   Glucose 94 65 - 99 mg/dL   BUN 13 6 - 24 mg/dL   Creatinine, Ser 5.78 0.57 - 1.00 mg/dL   GFR calc non Af Amer 103 >59 mL/min/1.73   GFR calc Af Amer 119 >59 mL/min/1.73   BUN/Creatinine Ratio 21 9 - 23   Sodium 138 134 - 144 mmol/L   Potassium 4.4 3.5 - 5.2 mmol/L   Chloride 98 97 - 108 mmol/L   CO2 25 18 - 29 mmol/L   Calcium 9.4 8.7 - 10.2 mg/dL   Total Protein 6.5 6.0 - 8.5 g/dL   Albumin 4.2 3.5 - 5.5 g/dL   Globulin, Total 2.3 1.5 - 4.5 g/dL   Albumin/Globulin Ratio 1.8 1.1 - 2.5   Bilirubin Total 0.2 0.0 - 1.2 mg/dL   Alkaline Phosphatase 57 39 - 117 IU/L   AST 14 0 - 40 IU/L   ALT 15 0 - 32 IU/L  Lipid panel     Status: Abnormal   Collection Time: 12/24/14  8:54 AM  Result Value Ref Range   Cholesterol, Total 253 (H) 100 - 199 mg/dL   Triglycerides 469 0 - 149 mg/dL   HDL 61 >62 mg/dL    Comment: According to ATP-III Guidelines, HDL-C >59 mg/dL is considered a negative risk factor for CHD.    VLDL Cholesterol Cal 21 5 - 40 mg/dL   LDL Calculated 952 (H) 0 - 99 mg/dL   Chol/HDL Ratio 4.1 0.0 - 4.4 ratio units    Comment:                                   T. Chol/HDL Ratio                                             Men  Women                               1/2 Avg.Risk  3.4    3.3  Avg.Risk  5.0    4.4                                2X Avg.Risk  9.6    7.1                                3X Avg.Risk 23.4   11.0   TSH     Status: None   Collection Time: 12/24/14  8:54 AM  Result Value Ref Range   TSH 0.664 0.450 - 4.500 uIU/mL     Assessment & Plan  1. Cluster headache, not intractable, unspecified chronicity pattern Discussed diagnosis, treatment options, other triggering etiologies such as sinus pathology or intracranial  lesion (less likely but I have kept this in mind). Plan to start prednisone and imitrex if no improvement may need head and sinus imaging.   - predniSONE (DELTASONE) 10 MG tablet; Take 1 tablet (10 mg total) by mouth 2 (two) times daily with a meal.  Dispense: 10 tablet; Refill: 0 - SUMAtriptan (IMITREX) 100 MG tablet; Take 1 tablet by mouth at onset of headache, may repeat 2 hrs later x1 if needed, max 200 mg/day  Dispense: 10 tablet; Refill: 2  2. Allergic rhinitis with postnasal drip Continue flonase.   3. Hot flash, menopausal Estrogen replacement may be contributing to headaches.   4. Generalized anxiety disorder May benefit from trial of Wellbutrin addition to Effexor. Trintellix or Celexa may be an alternative therapy. I do not recommend increasing benzo dose or frequency if possible at this time.  - Ambulatory referral to Psychiatry

## 2015-02-21 ENCOUNTER — Encounter: Payer: Self-pay | Admitting: Family Medicine

## 2015-02-21 ENCOUNTER — Ambulatory Visit (INDEPENDENT_AMBULATORY_CARE_PROVIDER_SITE_OTHER): Payer: BLUE CROSS/BLUE SHIELD | Admitting: Family Medicine

## 2015-02-21 DIAGNOSIS — Y9210 Unspecified residential institution as the place of occurrence of the external cause: Secondary | ICD-10-CM | POA: Insufficient documentation

## 2015-02-21 DIAGNOSIS — M25559 Pain in unspecified hip: Secondary | ICD-10-CM | POA: Insufficient documentation

## 2015-02-21 DIAGNOSIS — M25552 Pain in left hip: Secondary | ICD-10-CM

## 2015-02-21 DIAGNOSIS — W19XXXA Unspecified fall, initial encounter: Secondary | ICD-10-CM | POA: Insufficient documentation

## 2015-02-21 MED ORDER — KETOROLAC TROMETHAMINE 30 MG/ML IJ SOLN: 30.0000 mg | Freq: Once | INTRAMUSCULAR | Status: AC

## 2015-02-21 MED ORDER — CYCLOBENZAPRINE HCL 5 MG PO TABS: 5.0000 mg | ORAL_TABLET | Freq: Every evening | ORAL | Status: DC | PRN

## 2015-02-21 MED ORDER — TRAMADOL HCL 50 MG PO TABS: 50.0000 mg | ORAL_TABLET | Freq: Four times a day (QID) | ORAL | Status: DC | PRN

## 2015-02-21 NOTE — Patient Instructions (Signed)
Contusion °A contusion is a deep bruise. Contusions are the result of an injury that caused bleeding under the skin. The contusion may turn blue, purple, or yellow. Minor injuries will give you a painless contusion, but more severe contusions may stay painful and swollen for a few weeks.  °CAUSES  °A contusion is usually caused by a blow, trauma, or direct force to an area of the body. °SYMPTOMS  °· Swelling and redness of the injured area. °· Bruising of the injured area. °· Tenderness and soreness of the injured area. °· Pain. °DIAGNOSIS  °The diagnosis can be made by taking a history and physical exam. An X-ray, CT scan, or MRI may be needed to determine if there were any associated injuries, such as fractures. °TREATMENT  °Specific treatment will depend on what area of the body was injured. In general, the best treatment for a contusion is resting, icing, elevating, and applying cold compresses to the injured area. Over-the-counter medicines may also be recommended for pain control. Ask your caregiver what the best treatment is for your contusion. °HOME CARE INSTRUCTIONS  °· Put ice on the injured area. °¨ Put ice in a plastic bag. °¨ Place a towel between your skin and the bag. °¨ Leave the ice on for 15-20 minutes, 3-4 times a day, or as directed by your health care provider. °· Only take over-the-counter or prescription medicines for pain, discomfort, or fever as directed by your caregiver. Your caregiver may recommend avoiding anti-inflammatory medicines (aspirin, ibuprofen, and naproxen) for 48 hours because these medicines may increase bruising. °· Rest the injured area. °· If possible, elevate the injured area to reduce swelling. °SEEK IMMEDIATE MEDICAL CARE IF:  °· You have increased bruising or swelling. °· You have pain that is getting worse. °· Your swelling or pain is not relieved with medicines. °MAKE SURE YOU:  °· Understand these instructions. °· Will watch your condition. °· Will get help right  away if you are not doing well or get worse. °Document Released: 02/28/2005 Document Revised: 05/26/2013 Document Reviewed: 03/26/2011 °[employer]® Patient Information ©2015 [employer]. This information is not intended to replace advice given to you by your health care provider. Make sure you discuss any questions you have with your health care provider. ° °

## 2015-02-21 NOTE — Progress Notes (Signed)
Name: Katherine Diaz   MRN: [id number]    DOB: 1961/04/02   Date:02/21/2015       Progress Note  Subjective  Chief Complaint  Chief Complaint  Patient presents with  . Fall    patient fell off her mom's bed on Saturday night and landed on the concrete floor. pain has been non-responsive to Motrin & Tylenol.    HPI  Katherine Diaz is a 54 year old female here with an acute concern. She was spending the night at her mother's house on Saturday night and rolled over in the middle of her sleep to reach for something on the bedside night stand and underestimated the distance and rolled off the bed and fell onto the floor on her left side. She denies bruising or difficulty taking deep breaths or changes in her gait. She has been using motrin and tylenol but did not get much sleep last night.   Active Ambulatory Problems    Diagnosis Date Noted  . Anxiety disorder 12/17/2014  . Gastro-esophageal reflux disease without esophagitis 12/17/2014  . Hot flash, menopausal 12/17/2014  . Hyperlipidemia LDL goal <100 12/17/2014  . Insomnia, uncontrolled 12/17/2014  . Atheroma, skin 12/17/2014  . Other fatigue 12/17/2014  . Cluster headache 02/15/2015  . Allergic rhinitis with postnasal drip 02/15/2015  . Fall as cause of accidental injury in residential institution as place of occurrence 02/21/2015  . Hip pain, acute 02/21/2015   Resolved Ambulatory Problems    Diagnosis Date Noted  . No Resolved Ambulatory Problems   Past Medical History  Diagnosis Date  . Insomnia   . GERD (gastroesophageal reflux disease)   . Hyperlipidemia   . Hot flashes     Social History  Substance Use Topics  . Smoking status: Former Games developer  . Smokeless tobacco: Not on file  . Alcohol Use: No     Current outpatient prescriptions:  .  clonazePAM (KLONOPIN) 1 MG tablet, Take 1 tablet (1 mg total) by mouth 2 (two) times daily as needed for anxiety., Disp: 60 tablet, Rfl: 5 .  MIMVEY 1-0.5 MG per tablet, TAKE 1  TABLET BY MOUTH EVERY DAY FOR 28 DAYS, Disp: , Rfl: 2 .  pantoprazole (PROTONIX) 40 MG tablet, Take by mouth., Disp: , Rfl:  .  Pitavastatin Calcium (LIVALO) 2 MG TABS, Take 1 tablet (2 mg total) by mouth at bedtime., Disp: 30 tablet, Rfl: 3 .  predniSONE (DELTASONE) 10 MG tablet, Take 1 tablet (10 mg total) by mouth 2 (two) times daily with a meal., Disp: 10 tablet, Rfl: 0 .  SUMAtriptan (IMITREX) 100 MG tablet, Take 1 tablet by mouth at onset of headache, may repeat 2 hrs later x1 if needed, max 200 mg/day, Disp: 10 tablet, Rfl: 2 .  traZODone (DESYREL) 150 MG tablet, Take 1 tablet (150 mg total) by mouth at bedtime., Disp: 90 tablet, Rfl: 3 .  venlafaxine (EFFEXOR) 75 MG tablet, Take 75 mg by mouth daily., Disp: , Rfl: 2 .  venlafaxine XR (EFFEXOR-XR) 37.5 MG 24 hr capsule, Take 37.5 mg by mouth daily., Disp: , Rfl: 3  Past Surgical History  Procedure Laterality Date  . Cesarean section    . Eye surgery      amblyopia    Family History  Problem Relation Age of Onset  . Heart disease Paternal Grandmother   . Hypertension Paternal Grandmother     Allergies  Allergen Reactions  . Atorvastatin   . Codeine Nausea And Vomiting  Review of Systems  CONSTITUTIONAL: No significant weight changes, fever, chills, weakness or fatigue.  CARDIOVASCULAR: No chest pain, chest pressure or chest discomfort. No palpitations or edema.  RESPIRATORY: No shortness of breath, cough or sputum.  NEUROLOGICAL: No headache, dizziness, syncope, paralysis, ataxia. No memory changes. No change in bowel or bladder control.  MUSCULOSKELETAL: Yes joint pain. Yes muscle pain. HEMATOLOGIC: No anemia, bleeding or bruising.  LYMPHATICS: No enlarged lymph nodes.  PSYCHIATRIC: No change in mood. No change in sleep pattern.  ENDOCRINOLOGIC: No reports of sweating, cold or heat intolerance. No polyuria or polydipsia.     Objective  BP 126/84 mmHg  Pulse 76  Temp(Src) 97.7 F (36.5 C) (Oral)  Resp 16  Wt  185 lb 8 oz (84.142 kg)  SpO2 97% Body mass index is 30.87 kg/(m^2).  Physical Exam  Constitutional: Patient appears well-developed and well-nourished. In no distress.  Cardiovascular: Normal rate, regular rhythm and normal heart sounds.  No murmur heard.  Pulmonary/Chest: Effort normal and breath sounds normal. No respiratory distress. Musculoskeletal: Normal range of motion bilateral UE and LE, no joint effusions. Full ROM at hip, knee, ankle joints bilaterally with no contusions on body.  Peripheral vascular: Bilateral LE no edema. Neurological: CN II-XII grossly intact with no focal deficits. Alert and oriented to person, place, and time. Coordination, balance, strength, speech and gait are normal.  Skin: Skin is warm and dry. No rash noted. No erythema.  Psychiatric: Patient has a stable mood and affect. Behavior is normal in office today. Judgment and thought content normal in office today.   Assessment & Plan  1. Hip pain, acute, left Unlikely to have fracture as clinically there are no significant findings. Offered x-ray examination but patient refused. She agreed to Toradol injection 30 mg IM x1 and she will use Tramadol and Flexeril prn at home.   - ketorolac (TORADOL) 30 MG/ML injection 30 mg; Inject 1 mL (30 mg total) into the muscle once. - traMADol (ULTRAM) 50 MG tablet; Take 1 tablet (50 mg total) by mouth every 6 (six) hours as needed for moderate pain or severe pain.  Dispense: 40 tablet; Refill: 0 - cyclobenzaprine (FLEXERIL) 5 MG tablet; Take 1-2 tablets (5-10 mg total) by mouth at bedtime as needed for muscle spasms.  Dispense: 40 tablet; Refill: 0

## 2015-02-22 ENCOUNTER — Other Ambulatory Visit: Payer: Self-pay | Admitting: Family Medicine

## 2015-02-22 ENCOUNTER — Telehealth: Payer: Self-pay | Admitting: Family Medicine

## 2015-02-22 NOTE — Telephone Encounter (Signed)
Pt states her pain is worse today than yesterday. She would like a call back with advise on what else she can do if anything.

## 2015-02-22 NOTE — Telephone Encounter (Signed)
Tried to contact this patient to inform her of Dr. Debby Freiberg recommendations but there was no answer. A message was left for her stating what the options are and for her to give [place] a call with her decision.

## 2015-02-22 NOTE — Telephone Encounter (Signed)
I am sorry but there is not much more I can do other than offer an x-ray of her hip area. I have already prescribed tramadol as she does not like opioid medications.

## 2015-02-23 ENCOUNTER — Other Ambulatory Visit: Payer: Self-pay | Admitting: Family Medicine

## 2015-02-23 ENCOUNTER — Telehealth: Payer: Self-pay | Admitting: Family Medicine

## 2015-02-23 DIAGNOSIS — Y9210 Unspecified residential institution as the place of occurrence of the external cause: Secondary | ICD-10-CM

## 2015-02-23 DIAGNOSIS — M25552 Pain in left hip: Secondary | ICD-10-CM

## 2015-02-23 DIAGNOSIS — W19XXXA Unspecified fall, initial encounter: Secondary | ICD-10-CM

## 2015-02-23 NOTE — Telephone Encounter (Signed)
Pt said that the dr told her if she though that she needs xray to just let her know for she is still in a lot of pain. Asking for xrays. Please advise

## 2015-02-23 NOTE — Telephone Encounter (Signed)
Patient was informed via voice mail.

## 2015-02-23 NOTE — Telephone Encounter (Signed)
Let her know to proceed to outpatient imaging across the street from [place], I have ordered Left sided ribs, hip x-ray along with lumbar spine x-ray.

## 2015-03-02 ENCOUNTER — Other Ambulatory Visit: Payer: Self-pay | Admitting: Family Medicine

## 2015-03-03 ENCOUNTER — Other Ambulatory Visit: Payer: Self-pay | Admitting: Family Medicine

## 2015-03-03 DIAGNOSIS — M25552 Pain in left hip: Secondary | ICD-10-CM

## 2015-03-03 MED ORDER — CYCLOBENZAPRINE HCL 5 MG PO TABS
5.0000 mg | ORAL_TABLET | Freq: Every evening | ORAL | Status: DC | PRN
Start: 2015-03-03 — End: 2015-03-30

## 2015-03-03 NOTE — Telephone Encounter (Signed)
Patient has asked the pharmacy to send in a refill request on flexeril (generic). She is still having issues with her tail bone.

## 2015-03-03 NOTE — Telephone Encounter (Signed)
Refill request was sent to Dr. Ashany Sundaram for approval and submission.  

## 2015-03-04 ENCOUNTER — Ambulatory Visit
Admission: RE | Admit: 2015-03-04 | Discharge: 2015-03-04 | Disposition: A | Discharge status: Home / Self Care | Payer: BLUE CROSS/BLUE SHIELD | Source: Ambulatory Visit | Attending: Family Medicine | Admitting: Family Medicine

## 2015-03-04 ENCOUNTER — Other Ambulatory Visit: Payer: Self-pay | Admitting: Family Medicine

## 2015-03-04 ENCOUNTER — Telehealth: Payer: Self-pay | Admitting: Family Medicine

## 2015-03-04 DIAGNOSIS — M25552 Pain in left hip: Secondary | ICD-10-CM

## 2015-03-04 DIAGNOSIS — W19XXXA Unspecified fall, initial encounter: Secondary | ICD-10-CM | POA: Insufficient documentation

## 2015-03-04 DIAGNOSIS — Y9210 Unspecified residential institution as the place of occurrence of the external cause: Secondary | ICD-10-CM | POA: Insufficient documentation

## 2015-03-04 NOTE — Telephone Encounter (Signed)
Correct order was placed.

## 2015-03-04 NOTE — Telephone Encounter (Signed)
[facility] received order or DG Hip but she states that it should be DG Hip unilateral left

## 2015-03-30 ENCOUNTER — Ambulatory Visit (INDEPENDENT_AMBULATORY_CARE_PROVIDER_SITE_OTHER): Payer: BLUE CROSS/BLUE SHIELD | Admitting: Family Medicine

## 2015-03-30 ENCOUNTER — Encounter: Payer: Self-pay | Admitting: Family Medicine

## 2015-03-30 VITALS — BP 110/60 | HR 72 | Temp 98.6°F | Resp 14 | Wt 180.9 lb

## 2015-03-30 DIAGNOSIS — F3160 Bipolar disorder, current episode mixed, unspecified: Secondary | ICD-10-CM

## 2015-03-30 MED ORDER — LAMOTRIGINE 200 MG PO TABS: 200.0000 mg | ORAL_TABLET | Freq: Every day | ORAL | Status: DC

## 2015-03-30 NOTE — Progress Notes (Signed)
Name: Katherine Diaz   MRN: [id number]    DOB: [birth year]   Date:03/30/2015       Progress Note  Subjective  Chief Complaint  Chief Complaint  Patient presents with  . Medication Management    HPI  Katherine Diaz follows up for mood fluctuations altering from moments of high energy to feeling low. Previously she had considered it was just anxiety however now she is considering Bipolar as a diagnosis. She does have a psychiatry appointment coming up in December but her symptoms are progressing so she wanted to come in today and discuss some options. Patient complains of feeling wound up, anxious, panic attacks followed by low mood and sleep disturbance. She has the following symptoms: difficulty concentrating, fatigue, feelings of losing control, insomnia, irritable, palpitations, psychomotor agitation, racing thoughts. Onset of symptoms was approximately several years ago, stable since that time but recently feeling like her symptoms are not well controlled. 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 Family history significant for anxiety and depression. Possible organic causes contributing are: medications, endocrine/metabolic. Risk factors: previous episode of depression. Previous treatment includes Venlafaxine, benzodiazepines Ativan, Xanax, more recently clonazepam. She complains of the following side effects from the treatment: none.   Past Medical History  Diagnosis Date  . Insomnia   . GERD (gastroesophageal reflux disease)   . Hyperlipidemia   . Hot flashes     Patient Active Problem List   Diagnosis Date Noted  . Fall as cause of accidental injury in residential institution as place of occurrence 02/21/2015  . Hip pain, acute 02/21/2015  . Cluster headache 02/15/2015  . Allergic rhinitis with postnasal drip 02/15/2015  . Bipolar affective disorder, current episode mixed, without psychotic features (HCC) 12/17/2014  .  Gastro-esophageal reflux disease without esophagitis 12/17/2014  . Hot flash, menopausal 12/17/2014  . Hyperlipidemia LDL goal <100 12/17/2014  . Insomnia, uncontrolled 12/17/2014  . Atheroma, skin 12/17/2014  . Other fatigue 12/17/2014    Social History  Substance Use Topics  . Smoking status: Former Games developer  . Smokeless tobacco: Not on file  . Alcohol Use: No     Current outpatient prescriptions:  .  clonazePAM (KLONOPIN) 1 MG tablet, Take 1 tablet (1 mg total) by mouth 2 (two) times daily as needed for anxiety., Disp: 60 tablet, Rfl: 5 .  MIMVEY 1-0.5 MG per tablet, TAKE 1 TABLET BY MOUTH EVERY DAY FOR 28 DAYS, Disp: , Rfl: 2 .  pantoprazole (PROTONIX) 40 MG tablet, Take by mouth., Disp: , Rfl:  .  Pitavastatin Calcium (LIVALO) 2 MG TABS, Take 1 tablet (2 mg total) by mouth at bedtime., Disp: 30 tablet, Rfl: 3 .  venlafaxine (EFFEXOR) 75 MG tablet, Take 75 mg by mouth daily., Disp: , Rfl: 2 .  venlafaxine XR (EFFEXOR-XR) 37.5 MG 24 hr capsule, Take 37.5 mg by mouth daily., Disp: , Rfl: 3 .  cyclobenzaprine (FLEXERIL) 5 MG tablet, TAKE 1 TO 2 TABLETS BY MOUTH AT BEDTIME AS NEEDED FOR MUSCLE SPASMS (Patient not taking: Reported on 03/30/2015), Disp: 40 tablet, Rfl: 1 .  cyclobenzaprine (FLEXERIL) 5 MG tablet, Take 1-2 tablets (5-10 mg total) by mouth at bedtime as needed for muscle spasms., Disp: 40 tablet, Rfl: 2 .  lamoTRIgine (LAMICTAL) 200 MG tablet, Take 1 tablet (200 mg total) by mouth daily., Disp: 90 tablet, Rfl: 3 .  predniSONE (DELTASONE) 10 MG tablet, Take 1 tablet (10 mg total) by mouth 2 (two) times daily with a meal. (  Patient not taking: Reported on 03/30/2015), Disp: 10 tablet, Rfl: 0 .  SUMAtriptan (IMITREX) 100 MG tablet, Take 1 tablet by mouth at onset of headache, may repeat 2 hrs later x1 if needed, max 200 mg/day (Patient not taking: Reported on 03/30/2015), Disp: 10 tablet, Rfl: 2 .  traMADol (ULTRAM) 50 MG tablet, Take 1 tablet (50 mg total) by mouth every 6 (six)  hours as needed for moderate pain or severe pain. (Patient not taking: Reported on 03/30/2015), Disp: 40 tablet, Rfl: 0 .  traZODone (DESYREL) 150 MG tablet, Take 1 tablet (150 mg total) by mouth at bedtime. (Patient not taking: Reported on 03/30/2015), Disp: 90 tablet, Rfl: 3  Allergies  Allergen Reactions  . Atorvastatin   . Codeine Nausea And Vomiting    Review of Systems  Positive for fluctuating moods as mentioned in HPI, otherwise all systems reviewed and are negative.  Objective  BP 110/60 mmHg  Pulse 72  Temp(Src) 98.6 F (37 C) (Oral)  Resp 14  Wt 180 lb 14.4 oz (82.056 kg)  SpO2 95%  Body mass index is 30.1 kg/(m^2).   Physical Exam  Constitutional: Patient appears well-developed and well-nourished. In no distress.   Cardiovascular: Normal rate, regular rhythm and normal heart sounds.  No murmur heard.  Pulmonary/Chest: Effort normal and breath sounds normal. No respiratory distress. Neurological: CN II-XII grossly intact with no focal deficits. Alert and oriented to person, place, and time. Coordination, balance, strength, speech and gait are normal.  Skin: Skin is warm and dry. No rash noted. No erythema.  Psychiatric: Patient has a stable mood and affect. Behavior is normal in office today. Judgment and thought content normal in office today.    Assessment & Plan  1. Bipolar affective disorder, current episode mixed, without psychotic features (HCC) Discussed treatment options and decided on trial of Lamictal 200mg  po q day in addition to the Effexor she is already on. The patient has been counseled on the proper use, side effects and potential interactions of the new medication. Patient encouraged to review the side effects and safety profile pamphlet provided with the prescription from the pharmacy as well as request counseling from the pharmacy team as needed.   - lamoTRIgine (LAMICTAL) 200 MG tablet; Take 1 tablet (200 mg total) by mouth daily.  Dispense: 90  tablet; Refill: 3

## 2015-04-12 ENCOUNTER — Telehealth: Payer: Self-pay | Admitting: Family Medicine

## 2015-04-12 NOTE — Telephone Encounter (Signed)
Patient is wanting to know if she can increase her dosage of Lamictal 200mg  tablet from 1 a day to 2 a day.  She stated that her body is resistant to medication and the increased seems to work for her.

## 2015-04-14 ENCOUNTER — Other Ambulatory Visit: Payer: Self-pay | Admitting: Family Medicine

## 2015-04-14 DIAGNOSIS — F3160 Bipolar disorder, current episode mixed, unspecified: Secondary | ICD-10-CM

## 2015-04-14 MED ORDER — LAMOTRIGINE 200 MG PO TABS: 200.0000 mg | ORAL_TABLET | Freq: Two times a day (BID) | ORAL | Status: DC

## 2015-04-14 NOTE — Telephone Encounter (Signed)
Patient was informed and appreciative. Then she asked if I could tell Dr. Sherley BoundsSundaram that she said, "I love her and all [place] people because of her." I stated that I would relay the message.

## 2015-04-14 NOTE — Telephone Encounter (Signed)
Lamictal 200 mg TWO A DAY (400mg /24hrs) is the max dose safely prescribed but not usually for her symptoms, usually for seizures. But if it is helping her symptoms AND her insurance will cover the medication as twice a day dosing I am willing to prescribe it as such. New Rx sent for more quantity.

## 2015-05-25 ENCOUNTER — Other Ambulatory Visit: Payer: Self-pay | Admitting: Family Medicine

## 2015-05-30 ENCOUNTER — Other Ambulatory Visit: Payer: Self-pay | Admitting: Family Medicine

## 2015-05-31 ENCOUNTER — Telehealth: Payer: Self-pay

## 2015-05-31 NOTE — Telephone Encounter (Signed)
Patient was informed that rx was faxed to preferred pharmacy.

## 2015-07-20 ENCOUNTER — Ambulatory Visit (INDEPENDENT_AMBULATORY_CARE_PROVIDER_SITE_OTHER): Payer: BLUE CROSS/BLUE SHIELD | Admitting: Family Medicine

## 2015-07-20 ENCOUNTER — Encounter: Payer: Self-pay | Admitting: Family Medicine

## 2015-07-20 VITALS — BP 124/78 | HR 71 | Temp 98.0°F | Resp 16 | Wt 163.0 lb

## 2015-07-20 DIAGNOSIS — E785 Hyperlipidemia, unspecified: Secondary | ICD-10-CM | POA: Diagnosis not present

## 2015-07-20 DIAGNOSIS — N951 Menopausal and female climacteric states: Secondary | ICD-10-CM | POA: Diagnosis not present

## 2015-07-20 DIAGNOSIS — K219 Gastro-esophageal reflux disease without esophagitis: Secondary | ICD-10-CM

## 2015-07-20 DIAGNOSIS — F3160 Bipolar disorder, current episode mixed, unspecified: Secondary | ICD-10-CM | POA: Diagnosis not present

## 2015-07-20 DIAGNOSIS — R110 Nausea: Secondary | ICD-10-CM | POA: Diagnosis not present

## 2015-07-20 MED ORDER — LAMOTRIGINE 200 MG PO TABS: 200.0000 mg | ORAL_TABLET | Freq: Two times a day (BID) | ORAL | Status: DC

## 2015-07-20 MED ORDER — CLONAZEPAM 2 MG PO TABS: 1.0000 mg | ORAL_TABLET | Freq: Two times a day (BID) | ORAL | Status: DC

## 2015-07-20 MED ORDER — VENLAFAXINE HCL ER 37.5 MG PO CP24: 37.5000 mg | ORAL_CAPSULE | Freq: Every day | ORAL | Status: DC

## 2015-07-20 NOTE — Progress Notes (Signed)
Name: Katherine Diaz   MRN: [id number]    DOB: 10/27/1960   Date:07/20/2015       Progress Note  Subjective  Chief Complaint  Chief Complaint  Patient presents with  . Medication Refill  . Depression  . Insomnia  . Hyperlipidemia  . Gastroesophageal Reflux    HPI  Katherine Diaz is a 55 year old female who follows up for mood fluctuations related to her Bipolar disorder. Patient complains of feeling wound up, anxious, panic attacks followed by low mood and sleep disturbance. She has the following symptoms: difficulty concentrating, fatigue, feelings of losing control, insomnia, irritable, palpitations, psychomotor agitation, racing thoughts. Onset of symptoms was approximately several years ago, stable since that time but recently feeling like her symptoms are not well controlled. 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 Family history significant for anxiety and depression. Possible organic causes contributing are: medications, endocrine/metabolic. Risk factors: previous episode of depression. Previous treatment includes Venlafaxine, benzodiazepines Ativan, Xanax, more recently clonazepam. At our last visit we added on Lamictal 200 mg once a day to her Effexor 37.5 mg one a day. She reports this is helping her tremendously and has even tried taking 2-3 tablets of the Lamictal. She is also using Clonazepam  2 in the morning and 2 in the evening. "Is this okay?" she asks.   Patient states the HRT her Gynecologist has her on causes her to be nauseous but adding of Phenergan to her regimen has helped offset her symptoms.   Patient presents with hyperlipidemia.  She was tested because co morbid conditions such as being overweight. There is a family history of hyperlipidemia. There is not a family history of early ischemia heart disease.  Not using her statin medication consistently. Would like to recheck her cholesterol since she has lost some  weight.   Paitent complains of heartburn. This has been associated with belching, heartburn and waterbrash.  She has no other symptoms. Symptoms have been present for several years. She denies dysphagia.  She has not lost unintentional weight. She denies melena, hematochezia, hematemesis, and coffee ground emesis. Medical therapy in the past has included antacids, H2 antagonists and proton pump inhibitors.  Past Medical History  Diagnosis Date  . Insomnia   . GERD (gastroesophageal reflux disease)   . Hyperlipidemia   . Hot flashes     Patient Active Problem List   Diagnosis Date Noted  . Fall as cause of accidental injury in residential institution as place of occurrence 02/21/2015  . Hip pain, acute 02/21/2015  . Cluster headache 02/15/2015  . Allergic rhinitis with postnasal drip 02/15/2015  . Bipolar affective disorder, current episode mixed, without psychotic features (HCC) 12/17/2014  . Gastro-esophageal reflux disease without esophagitis 12/17/2014  . Hot flash, menopausal 12/17/2014  . Hyperlipidemia LDL goal <100 12/17/2014  . Insomnia, uncontrolled 12/17/2014  . Atheroma, skin 12/17/2014  . Other fatigue 12/17/2014    Social History  Substance Use Topics  . Smoking status: Former Games developer  . Smokeless tobacco: Not on file  . Alcohol Use: No     Current outpatient prescriptions:  .  promethazine (PHENERGAN) 25 MG tablet, Take 25 mg by mouth every 6 (six) hours as needed for nausea or vomiting., Disp: , Rfl:  .  clonazePAM (KLONOPIN) 1 MG tablet, TAKE 1 TABLET BY MOUTH TWICE DAILY, Disp: 60 tablet, Rfl: 5 .  lamoTRIgine (LAMICTAL) 200 MG tablet, Take 1 tablet (200 mg total) by mouth 2 (two)  times daily., Disp: 180 tablet, Rfl: 2 .  MIMVEY 1-0.5 MG per tablet, TAKE 1 TABLET BY MOUTH EVERY DAY FOR 28 DAYS, Disp: , Rfl: 2 .  pantoprazole (PROTONIX) 40 MG tablet, TAKE 1 TABLET BY MOUTH EVERY DAY, Disp: 90 tablet, Rfl: 1 .  Pitavastatin Calcium (LIVALO) 2 MG TABS, Take 1  tablet (2 mg total) by mouth at bedtime., Disp: 30 tablet, Rfl: 3 .  traZODone (DESYREL) 150 MG tablet, Take 1 tablet (150 mg total) by mouth at bedtime. (Patient not taking: Reported on 03/30/2015), Disp: 90 tablet, Rfl: 3 .  venlafaxine XR (EFFEXOR-XR) 37.5 MG 24 hr capsule, Take 37.5 mg by mouth daily., Disp: , Rfl: 3  Past Surgical History  Procedure Laterality Date  . Cesarean section    . Eye surgery      amblyopia    Family History  Problem Relation Age of Onset  . Heart disease Paternal Grandmother   . Hypertension Paternal Grandmother     Allergies  Allergen Reactions  . Atorvastatin   . Codeine Nausea And Vomiting     Review of Systems  CONSTITUTIONAL: No fever, chills, weakness or fatigue.  CARDIOVASCULAR: No chest pain, chest pressure or chest discomfort. No palpitations or edema.  RESPIRATORY: No shortness of breath, cough or sputum.  GASTROINTESTINAL: No anorexia, nausea, vomiting. No changes in bowel habits. No abdominal pain or blood.  GENITOURINARY: No dysuria. No frequency. No discharge.  NEUROLOGICAL: No headache, dizziness, syncope, paralysis, ataxia, numbness or tingling in the extremities. No memory changes. No change in bowel or bladder control.  MUSCULOSKELETAL: No joint pain. No muscle pain. HEMATOLOGIC: No anemia, bleeding or bruising.  LYMPHATICS: No enlarged lymph nodes.  PSYCHIATRIC: Improved change in mood. No change in sleep pattern.  ENDOCRINOLOGIC: No reports of sweating, cold or heat intolerance. No polyuria or polydipsia.     Objective  BP 124/78 mmHg  Pulse 71  Temp(Src) 98 F (36.7 C) (Oral)  Resp 16  Wt 163 lb (73.936 kg)  SpO2 96% Body mass index is 27.12 kg/(m^2).  Physical Exam  Constitutional: Patient appears well-developed and well-nourished. In no distress.  HEENT:  - Head: Normocephalic and atraumatic.  - Ears: Bilateral TMs gray, no erythema or effusion - Nose: Nasal mucosa moist - Mouth/Throat: Oropharynx is  clear and moist. No tonsillar hypertrophy or erythema. No post nasal drainage.  - Eyes: Conjunctivae clear, EOM movements normal. PERRLA. No scleral icterus.  Neck: Normal range of motion. Neck supple. No JVD present. No thyromegaly present.  Cardiovascular: Normal rate, regular rhythm and normal heart sounds.  No murmur heard.  Pulmonary/Chest: Effort normal and breath sounds normal. No respiratory distress. Musculoskeletal: Normal range of motion bilateral UE and LE, no joint effusions. Peripheral vascular: Bilateral LE no edema. Neurological: CN II-XII grossly intact with no focal deficits. Alert and oriented to person, place, and time. Coordination, balance, strength, speech and gait are normal.  Skin: Skin is warm and dry. No rash noted. No erythema.  Psychiatric: Patient has a stable mood and affect. Behavior is normal in office today. Judgment and thought content normal in office today.   Assessment & Plan  1. Bipolar affective disorder, current episode mixed, without psychotic features (HCC) Really needs to stop self dosing and self medicating. Not safe at all. I advised her to ask me before trying out new doses of medication on her own. I have increased her clonopin from 1 mg po tid to 2 mg po bid as she is taking it  more often. Seems like she did not ever follow through with establishing with a psychiatrist.   - lamoTRIgine (LAMICTAL) 200 MG tablet; Take 1 tablet (200 mg total) by mouth 2 (two) times daily.  Dispense: 180 tablet; Refill: 1 - venlafaxine XR (EFFEXOR-XR) 37.5 MG 24 hr capsule; Take 1 capsule (37.5 mg total) by mouth daily.  Dispense: 90 capsule; Refill: 1 - clonazePAM (KLONOPIN) 2 MG tablet; Take 0.5 tablets (1 mg total) by mouth 2 (two) times daily.  Dispense: 180 tablet; Refill: 1  2. Hyperlipidemia LDL goal <100 Not taking statin consistently.   - Lipid panel  3. Gastro-esophageal reflux disease without esophagitis Stable.  4. Hot flash, menopausal On HRT  from gynecologist.   5. Nausea in adult Phenergan prescribed by gynecologist.

## 2015-07-21 LAB — LIPID PANEL
CHOLESTEROL TOTAL: 261 mg/dL — AB (ref 100–199)
Chol/HDL Ratio: 4.5 ratio units — ABNORMAL HIGH (ref 0.0–4.4)
HDL: 58 mg/dL (ref >39)
LDL CALC: 169 mg/dL — AB (ref 0–99)
Triglycerides: 169 mg/dL — ABNORMAL HIGH (ref 0–149)
VLDL CHOLESTEROL CAL: 34 mg/dL (ref 5–40)

## 2015-07-22 ENCOUNTER — Other Ambulatory Visit: Payer: Self-pay

## 2015-07-22 ENCOUNTER — Telehealth: Payer: Self-pay | Admitting: Family Medicine

## 2015-07-22 DIAGNOSIS — F3160 Bipolar disorder, current episode mixed, unspecified: Secondary | ICD-10-CM

## 2015-07-22 MED ORDER — CLONAZEPAM 2 MG PO TABS: 2.0000 mg | ORAL_TABLET | Freq: Two times a day (BID) | ORAL | Status: DC

## 2015-07-22 NOTE — Telephone Encounter (Signed)
Hi Katherine Diaz, I printed out the Clonazepam 2 mg take twice a day (total 4 mg a day) 90 day supply so #180 with 1 refill. Can you call it in or fax it to where the patient wants it to go to. Thank you!

## 2015-07-22 NOTE — Telephone Encounter (Signed)
Called in.

## 2015-07-22 NOTE — Telephone Encounter (Signed)
Patient states yall had talked about upping her dosage of klonopin from  to  a day?  It does state that in your note, but the rx was never changed.  Please refax new dosage

## 2015-08-15 ENCOUNTER — Ambulatory Visit: Payer: BLUE CROSS/BLUE SHIELD | Admitting: Family Medicine

## 2015-08-22 ENCOUNTER — Ambulatory Visit (INDEPENDENT_AMBULATORY_CARE_PROVIDER_SITE_OTHER): Payer: BLUE CROSS/BLUE SHIELD | Admitting: Podiatry

## 2015-08-22 ENCOUNTER — Encounter: Payer: Self-pay | Admitting: Podiatry

## 2015-08-22 ENCOUNTER — Ambulatory Visit (INDEPENDENT_AMBULATORY_CARE_PROVIDER_SITE_OTHER): Payer: BLUE CROSS/BLUE SHIELD

## 2015-08-22 VITALS — BP 124/76 | HR 61 | Resp 16 | Ht 67.0 in | Wt 150.0 lb

## 2015-08-22 DIAGNOSIS — M779 Enthesopathy, unspecified: Secondary | ICD-10-CM

## 2015-08-22 DIAGNOSIS — L84 Corns and callosities: Secondary | ICD-10-CM

## 2015-08-22 DIAGNOSIS — M79675 Pain in left toe(s): Secondary | ICD-10-CM

## 2015-08-22 MED ORDER — TRIAMCINOLONE ACETONIDE 10 MG/ML IJ SUSP
10.0000 mg | Freq: Once | INTRAMUSCULAR | Status: AC
  Administered 2015-08-22: 10 mg

## 2015-08-22 NOTE — Progress Notes (Signed)
   Subjective:    Patient ID: Katherine Diaz, female    DOB: 1960/08/11, 55 y.o.   MRN: [id number]  HPI Patient presents with callouses in their left foot; 5th toe-medial; interdigital 4th & 5th toe; x6 months.   Review of Systems  All other systems reviewed and are negative.      Objective:   Physical Exam        Assessment & Plan:

## 2015-08-23 NOTE — Progress Notes (Signed)
Subjective:     Patient ID: Katherine Diaz, female   DOB: [birth year], 55 y.o.   MRN: [id number]  HPI patient presents with exquisite discomfort between the fourth and fifth digits 6 months. States she's tried different callus pads and wider-type shoes without relief   Review of Systems  All other systems reviewed and are negative.      Objective:   Physical Exam  Constitutional: She is oriented to person, place, and time.  Cardiovascular: Intact distal pulses.   Musculoskeletal: Normal range of motion.  Neurological: She is oriented to person, place, and time.  Skin: Skin is warm.  Nursing note and vitals reviewed.  neurovascular status found to be intact with muscle strength adequate range of motion within normal limits. Patient's noted to have keratotic lesion between the fourth and fifth toes left foot that's painful when pressed and makes shoe gear difficult. It affects the head of the proximal phalanx fifth toe against the base of the fourth digit left and it is quite large in its appearance with no drainage or redness noted. Patient is noted to have good digital perfusion and is well oriented 3     Assessment:     Soft tissue interspace lesion fourth left with inflammatory capsulitis and bon against bone irritation. There is keratotic tissue formation    Plan:     H&P and conditions reviewed with patient along with x-ray. Today I did a capsular injection 3 mg dexamethasone Kenalog 5 mg Xylocaine and after anesthesia was achieved debrided the entire area. I advised that I will see her back when it reoccurs and it may ultimately require surgical intervention  X-ray report indicates abutment of the head of proximal phalanx fifth toe against a fourth toe left

## 2015-10-05 ENCOUNTER — Other Ambulatory Visit: Payer: Self-pay

## 2015-10-05 DIAGNOSIS — G4700 Insomnia, unspecified: Secondary | ICD-10-CM

## 2015-10-05 NOTE — Telephone Encounter (Signed)
Request for trazodone --> Denied Dr. Sherley BoundsSundaram prescribed one year's worth last July; she should not be out Appt needed if she wants/needs a refill so we can discuss

## 2015-10-06 ENCOUNTER — Other Ambulatory Visit: Payer: Self-pay

## 2015-10-06 DIAGNOSIS — G4700 Insomnia, unspecified: Secondary | ICD-10-CM

## 2015-10-06 NOTE — Telephone Encounter (Signed)
I received 2nd request for trazodone; pharmacist says she should not be out of the 150 mg strength; they just filled the 150 mg strength on March 31st for a 90 day supply  She ran a fill hx for me July 18th 50 mg for 90 day supply Oct 13th 50 mg for 90 day supply Feb 3rd 50 mg for 90 day supply  Trazodone 150 mg July 25th for 90 day supply Oct 19th for 90 day supply Jan 7th for 90 day supply Mar 31st for 90 day supply  I will not approve any additional trazodone; she has an appt to see me next week

## 2015-10-06 NOTE — Telephone Encounter (Signed)
Patient stated that Dr Sherley BoundsSundaram did not give her a year supply only a 3 month supply. I then explained that she will have to come in to be seen and patient made appointment for 10-14-15

## 2015-10-08 IMAGING — CR DG RIBS 2V*L*
1 series · 3 of 3 positions shown · non-contrast
Comparison: None.

CLINICAL DATA: Left-sided pain since a fall 2 weeks ago.

EXAM:
LEFT RIBS - 2 VIEW

[Series 1: dg ribs unilateral left · 0.14mm/px · 3 of 3 slices shown]
[im 1/3]
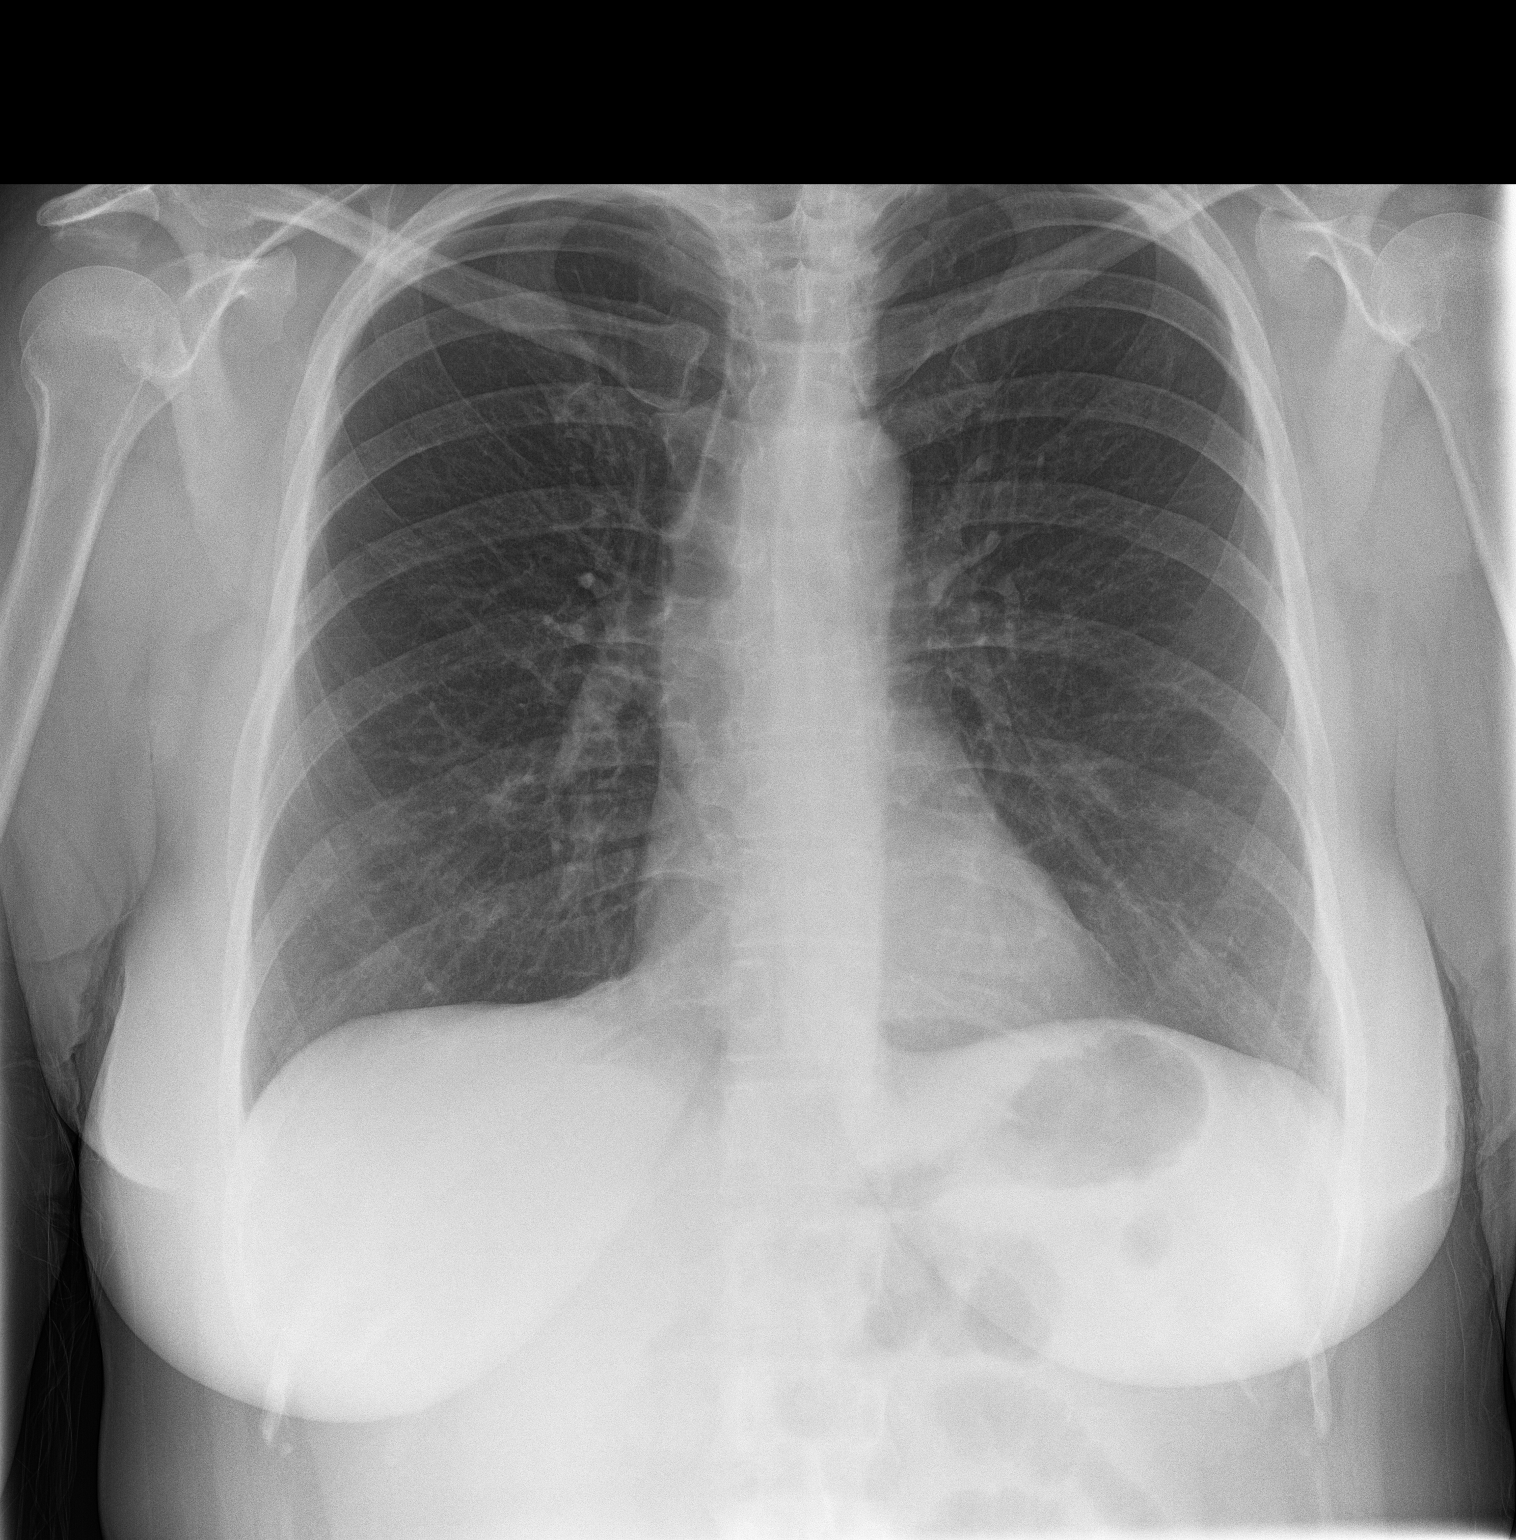
[im 2/3]
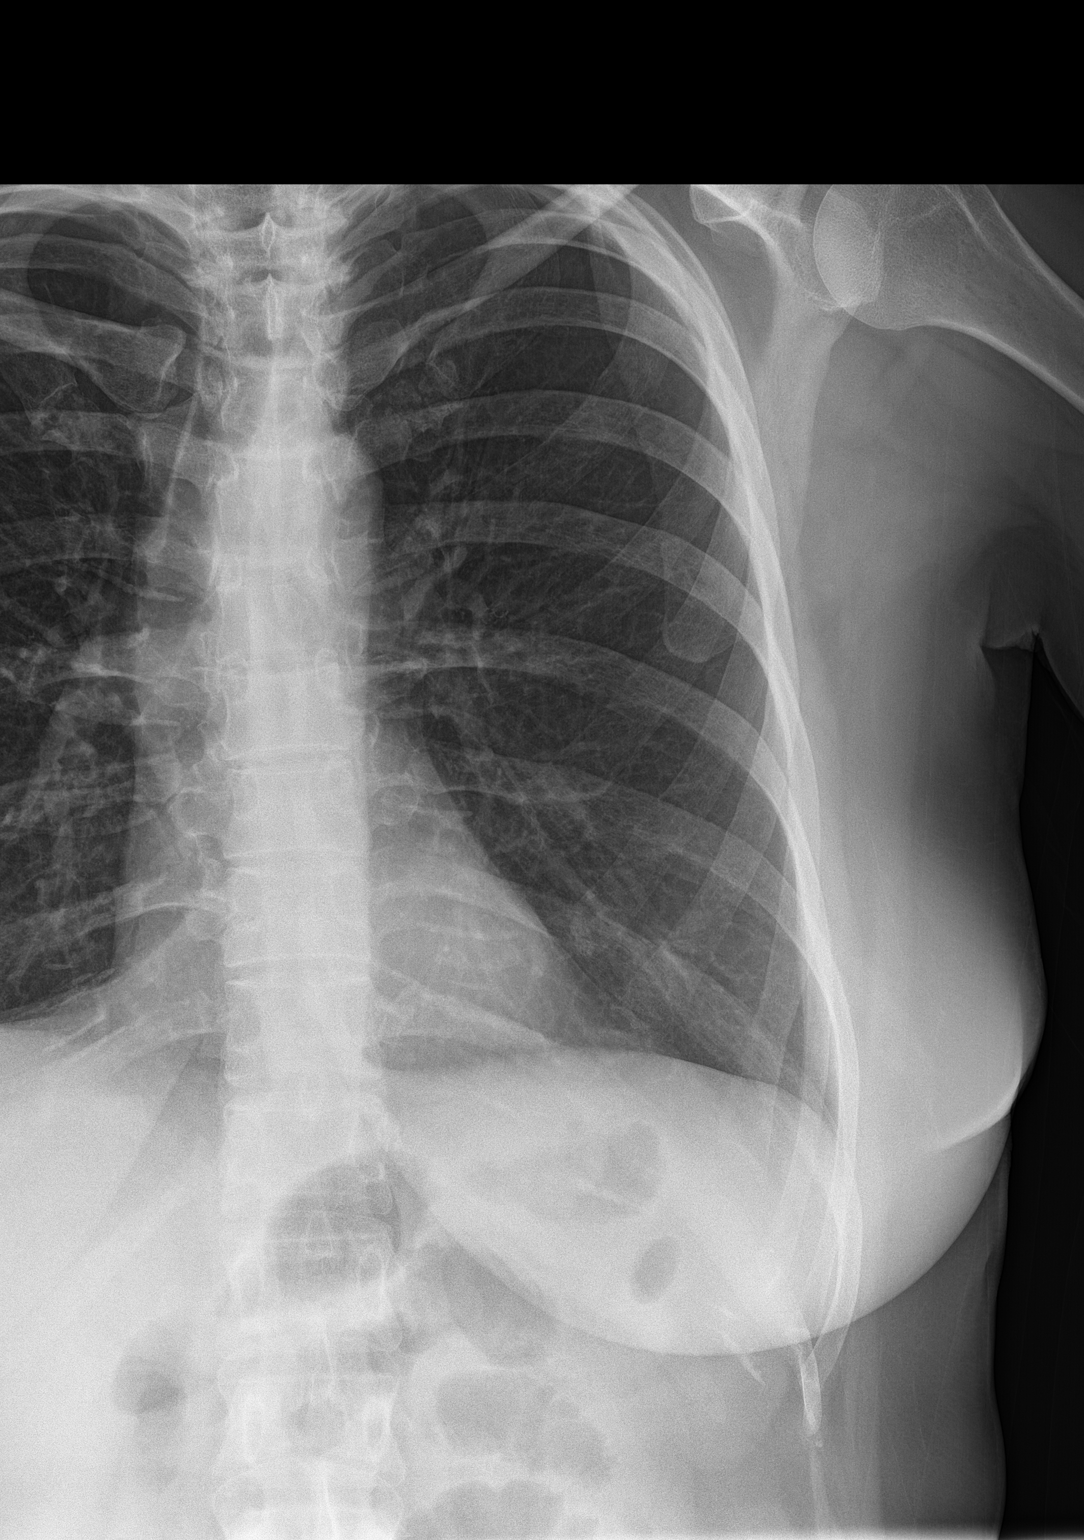
[im 3/3]
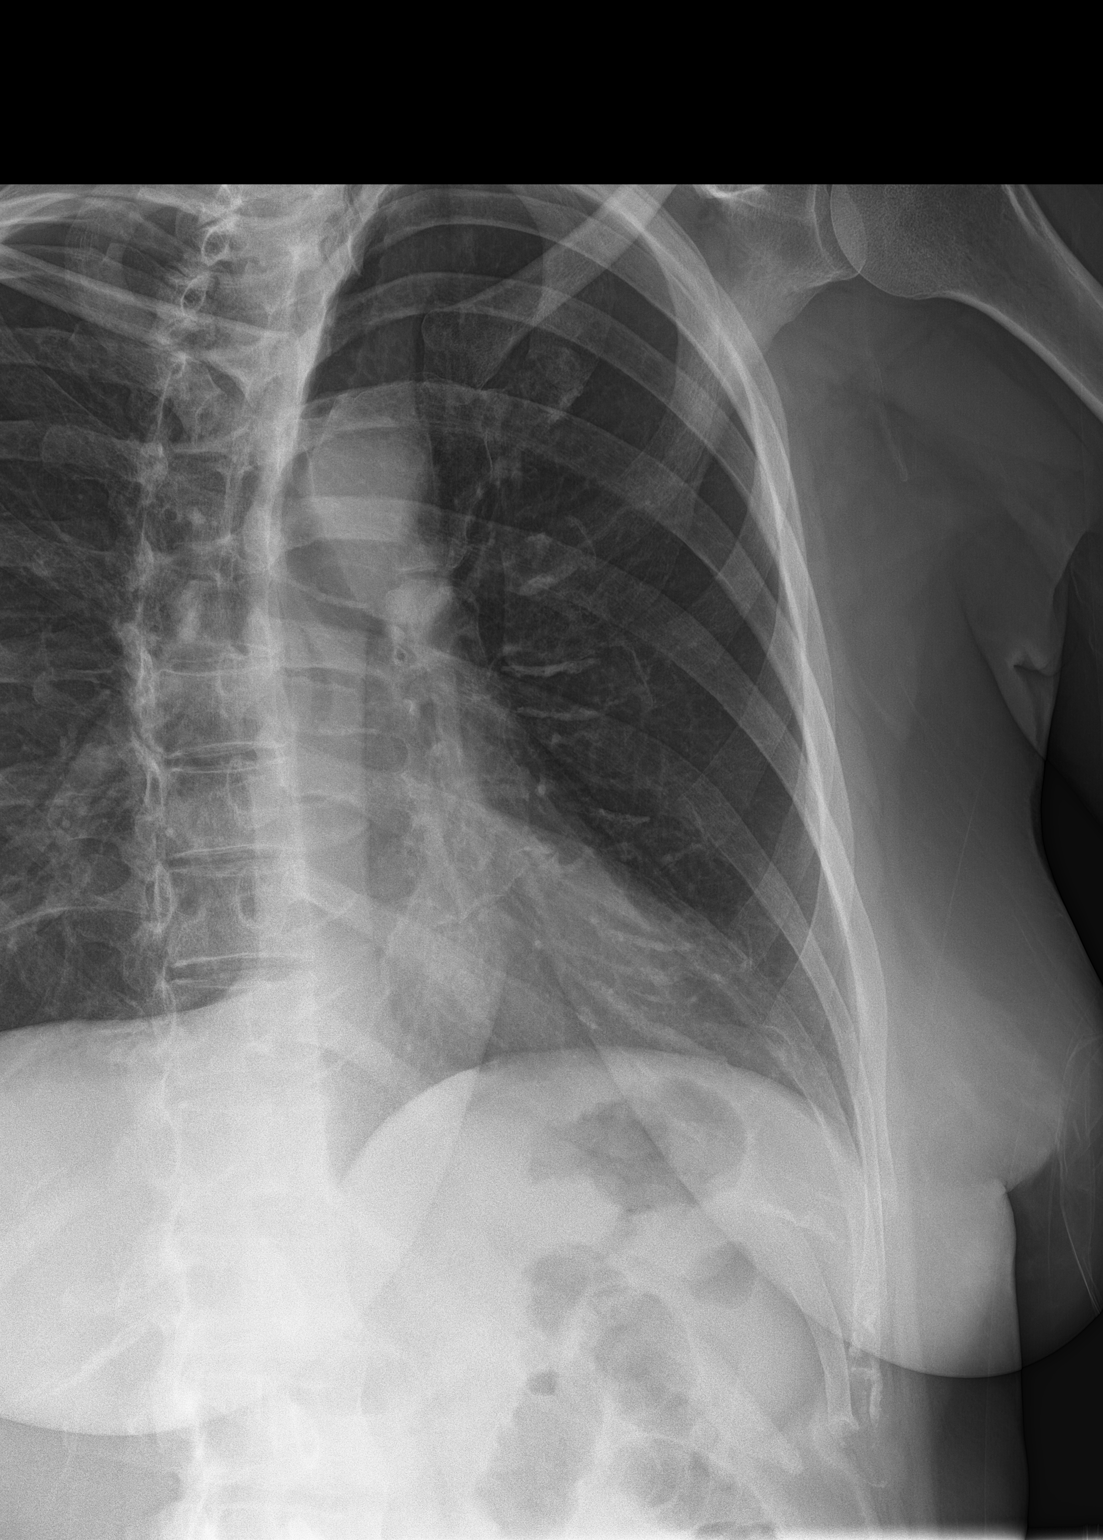

[3 of 3 positions shown; findings below may reference images not displayed]

FINDINGS: No fracture or other bone lesions are seen involving the ribs.
IMPRESSION: Negative.

## 2015-10-12 ENCOUNTER — Telehealth: Payer: Self-pay | Admitting: Family Medicine

## 2015-10-12 NOTE — Telephone Encounter (Signed)
LVM for patient stating that her medical records were ready for pick up.

## 2015-10-14 ENCOUNTER — Ambulatory Visit: Payer: BLUE CROSS/BLUE SHIELD | Admitting: Family Medicine

## 2015-10-17 ENCOUNTER — Encounter: Payer: Self-pay | Admitting: Medical

## 2015-10-17 ENCOUNTER — Ambulatory Visit (INDEPENDENT_AMBULATORY_CARE_PROVIDER_SITE_OTHER): Payer: BLUE CROSS/BLUE SHIELD | Admitting: Medical

## 2015-10-17 VITALS — BP 128/80 | HR 70 | Ht 64.5 in | Wt 166.0 lb

## 2015-10-17 DIAGNOSIS — F3170 Bipolar disorder, currently in remission, most recent episode unspecified: Secondary | ICD-10-CM

## 2015-10-17 DIAGNOSIS — R251 Tremor, unspecified: Secondary | ICD-10-CM

## 2015-10-17 DIAGNOSIS — G250 Essential tremor: Secondary | ICD-10-CM | POA: Diagnosis not present

## 2015-10-17 DIAGNOSIS — K219 Gastro-esophageal reflux disease without esophagitis: Secondary | ICD-10-CM | POA: Diagnosis not present

## 2015-10-17 DIAGNOSIS — G4700 Insomnia, unspecified: Secondary | ICD-10-CM | POA: Diagnosis not present

## 2015-10-17 DIAGNOSIS — F411 Generalized anxiety disorder: Secondary | ICD-10-CM

## 2015-10-17 NOTE — Progress Notes (Signed)
Subjective: Chief Complaint  Patient presents with  . New Patient (Initial Visit)  . Advice Only    just to get established. has form for record release   New patient.   Was [facility] [place] prior.   Wants to change providers.   Her prior PCP/favorite PCP moved to [place] which broke her heart.  Had a problem with the new doctor they gave her.    He kept making her come in for little this including just to talk about her GERD medication which her gynecologist and friend gave her a year refill on.    Sees a therapist some.   Is an attorney.    Sees [place] [facility] therapist.  Her old PCP was prescribing her medication, but she has seen psychiatrist in the past.  Takes Effexor 1.5 daily, pantoprazole 40mg  daily,  Takes Lamictal 200mg  BID, takes Clonazepam 2mg  BID which helps calms her nerves and helps with tremor when she is in the court room.  She notes high tolerance of medication, hasn't done well on low doses with mmedicationsfor mood, anxiety.   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 She notes that in front of people hands shake, the medications help with that.   Has issues with sleep.   The Klonopin calms her down before a trial, has done well on this.    Her paralegal can tell when she hasn't been taking this.   Nonsmoker.     Has hx/o heart murmur, has had at least 2 echocardiograms prior.      Past Medical History  Diagnosis Date  . Insomnia   . GERD (gastroesophageal reflux disease)   . Hyperlipidemia   . Hot flashes   . Heart murmur     pt reports 2 prior echocardiograms, stable as of 10/2015  . Allergy   . Wears glasses   . Anxiety   . Bipolar disorder (HCC)   . Menopausal state    ROS as in subjective  Objective: BP 128/80 mmHg  Pulse 70  Ht 5' 4.5" (1.638 m)  Wt 166 lb (75.297 kg)  BMI 28.06 kg/m2  Gen: wd, wn, nad, white female Psych: pleasant, good eye contact, somewhat flat  affect Heart: RRR, faint 1-2/6 holosystolic murmur, normal s2 Lungs clear Ext: no edema Pulses normal    Assessment: Encounter Diagnoses  Name Primary?  . Generalized anxiety disorder Yes  . Bipolar affective disorder in remission (HCC)   . Gastroesophageal reflux disease without esophagitis   . Insomnia   . Physiological tremor     Plan: reviewed her intake form, medical history.   Advised that we would request records for review.   She may be better off with beta blocker for tremor as I may not be able to prescribe Klonopin, but we will await records.   discussed case with supervising physician Dr. Susann GivensLalonde as well.   C/t current medications for now.

## 2015-10-18 ENCOUNTER — Encounter: Payer: Self-pay | Admitting: Medical

## 2015-10-18 ENCOUNTER — Telehealth: Payer: Self-pay | Admitting: Internal Medicine

## 2015-10-18 NOTE — Telephone Encounter (Signed)
Received [facility] records on pt from [facility]

## 2015-11-01 ENCOUNTER — Encounter: Payer: Self-pay | Admitting: Medical

## 2015-11-01 ENCOUNTER — Telehealth: Payer: Self-pay | Admitting: Medical

## 2015-11-01 NOTE — Telephone Encounter (Signed)
pls run [place] Controlled Substance report on this patient.  She is new to us.   Interestingly her visit with me she told me she was on Klonopin BID, but the limited few old chart notes shows Xanax, with #20 being prescribed at a time.

## 2015-11-02 NOTE — Telephone Encounter (Signed)
Done

## 2015-11-04 ENCOUNTER — Telehealth: Payer: Self-pay | Admitting: Medical

## 2015-11-04 NOTE — Telephone Encounter (Signed)
On desk

## 2015-11-04 NOTE — Telephone Encounter (Signed)
Please pull the prior records that came in on her.  I reviewed the [place] Controlled reporting system as of 11/02/15.   Of note, she has ben getting refills on Clonazepam 2mg , from [employer] [facility] provider [facility], but the doctor's ID is out of [place].   [facility] has been using [facility].  Quantities show  #60 in July and August 2016, then #40 in September, then #60 again monthly until February where it increased to #90, then #180 prescribed on 09/01/15.  These are a little different that what we discussed at her recent visit.

## 2015-11-04 NOTE — Telephone Encounter (Signed)
pls pull the records I received from [place], they are not scanned in but I need to see them.

## 2015-11-07 ENCOUNTER — Encounter: Payer: Self-pay | Admitting: Medical

## 2015-11-07 ENCOUNTER — Telehealth: Payer: Self-pay | Admitting: Medical

## 2015-11-07 NOTE — Telephone Encounter (Signed)
error 

## 2015-11-08 NOTE — Telephone Encounter (Signed)
I reviewed over her records that came in.  There are limited records from Dr. Senaida Oresichardson from 01/2015, and then there are recent refill records on clonazepam.  After reviewing the records, I would not be able to refill clonazepam.   i think there are probably better treatment recommendations for dealing with anxiety than her current treatment.    I would be happy to see her for primary care, routine medical care, but I am uncomfortable managing those medications for anxiety.   I would recommend she establish with psychiatry or at least have and updated consult with psychiatry to get their recommendations on anxiety treatment for her.

## 2015-11-08 NOTE — Telephone Encounter (Signed)
Left message for pt to call me back 

## 2015-11-09 NOTE — Telephone Encounter (Signed)
See other message as I have a made a decision regarding her clonazepam/anxiety

## 2015-11-09 NOTE — Telephone Encounter (Signed)
Records are on your desk

## 2015-11-11 ENCOUNTER — Encounter: Payer: Self-pay | Admitting: Medical

## 2015-11-11 NOTE — Telephone Encounter (Signed)
FYI: Pt stated that she would just "see another physician then"

## 2015-11-11 NOTE — Telephone Encounter (Signed)
Ok, I understand

## 2015-11-29 ENCOUNTER — Other Ambulatory Visit: Payer: Self-pay

## 2015-11-29 DIAGNOSIS — G4700 Insomnia, unspecified: Secondary | ICD-10-CM

## 2015-11-30 NOTE — Telephone Encounter (Signed)
Rx denied; not a patient here any longer

## 2016-01-24 ENCOUNTER — Encounter (HOSPITAL_BASED_OUTPATIENT_CLINIC_OR_DEPARTMENT_OTHER): Payer: Self-pay | Admitting: *Deleted

## 2016-01-25 ENCOUNTER — Ambulatory Visit: Payer: Self-pay | Admitting: Ophthalmology

## 2016-01-25 NOTE — H&P (Signed)
  Date of examination:  12-28-15  Indication for surgery: to straighten the eyes and allow some binocularity  Pertinent past medical history:  Past Medical History:  Diagnosis Date  . Allergy   . Anxiety   . Bipolar disorder (HCC)   . Complication of anesthesia   . GERD (gastroesophageal reflux disease)   . Heart murmur    pt reports 2 prior echocardiograms, stable as of 10/2015  . Hot flashes   . Hyperlipidemia   . Insomnia   . Menopausal state   . PONV (postoperative nausea and vomiting)   . Wears glasses     Pertinent ocular history:  Strabismus surgery 1968 details unknown.  1990 RMR advance 2 mm, RLR recession 5 mm adjustable, with excision of scar tissue; fat adherence described in op note.  2009 by me:  LLR reces 8.5 adjustable  Pertinent family history:  Family History  Problem Relation Age of Onset  . Heart disease Paternal Grandmother   . Hypertension Paternal Grandmother     General:  Healthy appearing patient in no distress.    Eyes:    Acuity cc OD 20/30  OS 20/25  External: Within normal limits     Anterior segment: Within normal limits x healed conj scars  Motility:   XT=6 with R DHD, total X=16 in primary.  2- LMR.    Fundus: deferred  Refraction: low minus OU  Heart: Regular rate and rhythm without murmur     Lungs: Clear to auscultation     Abdomen: Soft, nontender, normal bowel sounds     Impression:Exotropia/DHD, recurrent  Plan: RLR recess adjustable (vs. LMR advance/resect)  Shara BlazingYOUNG,Katherine Bordley O

## 2016-01-27 ENCOUNTER — Ambulatory Visit (HOSPITAL_BASED_OUTPATIENT_CLINIC_OR_DEPARTMENT_OTHER): Payer: BLUE CROSS/BLUE SHIELD | Admitting: Anesthesiology

## 2016-01-27 ENCOUNTER — Encounter (HOSPITAL_BASED_OUTPATIENT_CLINIC_OR_DEPARTMENT_OTHER): Payer: Self-pay

## 2016-01-27 ENCOUNTER — Ambulatory Visit (HOSPITAL_BASED_OUTPATIENT_CLINIC_OR_DEPARTMENT_OTHER)
Admission: RE | Admit: 2016-01-27 | Discharge: 2016-01-27 | Disposition: A | Discharge status: Home / Self Care | Payer: BLUE CROSS/BLUE SHIELD | Source: Ambulatory Visit | Attending: Ophthalmology | Admitting: Ophthalmology

## 2016-01-27 ENCOUNTER — Encounter (HOSPITAL_BASED_OUTPATIENT_CLINIC_OR_DEPARTMENT_OTHER)
Admission: RE | Disposition: A | Discharge status: Home / Self Care | Payer: Self-pay | Source: Ambulatory Visit | Attending: Ophthalmology

## 2016-01-27 DIAGNOSIS — Z87891 Personal history of nicotine dependence: Secondary | ICD-10-CM | POA: Diagnosis not present

## 2016-01-27 DIAGNOSIS — K219 Gastro-esophageal reflux disease without esophagitis: Secondary | ICD-10-CM | POA: Diagnosis not present

## 2016-01-27 DIAGNOSIS — G4700 Insomnia, unspecified: Secondary | ICD-10-CM | POA: Diagnosis not present

## 2016-01-27 DIAGNOSIS — H5010 Unspecified exotropia: Secondary | ICD-10-CM | POA: Insufficient documentation

## 2016-01-27 DIAGNOSIS — F419 Anxiety disorder, unspecified: Secondary | ICD-10-CM | POA: Diagnosis not present

## 2016-01-27 DIAGNOSIS — Z79899 Other long term (current) drug therapy: Secondary | ICD-10-CM | POA: Diagnosis not present

## 2016-01-27 HISTORY — DX: Other complications of anesthesia, initial encounter: T88.59XA

## 2016-01-27 HISTORY — DX: Other specified postprocedural states: R11.2

## 2016-01-27 HISTORY — DX: Other specified postprocedural states: Z98.890

## 2016-01-27 HISTORY — PX: STRABISMUS SURGERY: SHX218

## 2016-01-27 HISTORY — DX: Adverse effect of unspecified anesthetic, initial encounter: T41.45XA

## 2016-01-27 SURGERY — REPAIR STRABISMUS
Anesthesia: General | Site: Eye | Laterality: Left

## 2016-01-27 MED ORDER — GLYCOPYRROLATE 0.2 MG/ML IJ SOLN
0.2000 mg | Freq: Once | INTRAMUSCULAR | Status: AC | PRN
  Administered 2016-01-27: .2 mg via INTRAVENOUS

## 2016-01-27 MED ORDER — DEXAMETHASONE SODIUM PHOSPHATE 4 MG/ML IJ SOLN
INTRAMUSCULAR | Status: DC | PRN
  Administered 2016-01-27: 10 mg via INTRAVENOUS

## 2016-01-27 MED ORDER — MIDAZOLAM HCL 2 MG/2ML IJ SOLN
INTRAMUSCULAR | Status: AC
  Filled 2016-01-27: qty 2

## 2016-01-27 MED ORDER — FENTANYL CITRATE (PF) 100 MCG/2ML IJ SOLN
50.0000 ug | INTRAMUSCULAR | Status: DC | PRN
  Administered 2016-01-27: 100 ug via INTRAVENOUS

## 2016-01-27 MED ORDER — SCOPOLAMINE 1 MG/3DAYS TD PT72: 1.0000 | MEDICATED_PATCH | Freq: Once | TRANSDERMAL | Status: DC | PRN

## 2016-01-27 MED ORDER — PROMETHAZINE HCL 25 MG/ML IJ SOLN
6.2500 mg | INTRAMUSCULAR | Status: DC | PRN
  Administered 2016-01-27: 6.25 mg via INTRAVENOUS

## 2016-01-27 MED ORDER — FENTANYL CITRATE (PF) 100 MCG/2ML IJ SOLN
INTRAMUSCULAR | Status: AC
  Filled 2016-01-27: qty 2

## 2016-01-27 MED ORDER — PROPOFOL 10 MG/ML IV BOLUS
INTRAVENOUS | Status: DC | PRN
  Administered 2016-01-27: 180 mg via INTRAVENOUS

## 2016-01-27 MED ORDER — FENTANYL CITRATE (PF) 100 MCG/2ML IJ SOLN
25.0000 ug | INTRAMUSCULAR | Status: DC | PRN
  Administered 2016-01-27 (×2): 50 ug via INTRAVENOUS

## 2016-01-27 MED ORDER — MIDAZOLAM HCL 2 MG/2ML IJ SOLN
1.0000 mg | INTRAMUSCULAR | Status: DC | PRN
  Administered 2016-01-27: 2 mg via INTRAVENOUS

## 2016-01-27 MED ORDER — TOBRAMYCIN-DEXAMETHASONE 0.3-0.1 % OP OINT: 1.0000 "application " | TOPICAL_OINTMENT | Freq: Two times a day (BID) | OPHTHALMIC | 0 refills | Status: DC

## 2016-01-27 MED ORDER — KETOROLAC TROMETHAMINE 30 MG/ML IJ SOLN
INTRAMUSCULAR | Status: DC | PRN
  Administered 2016-01-27: 30 mg via INTRAVENOUS

## 2016-01-27 MED ORDER — LACTATED RINGERS IV SOLN
INTRAVENOUS | Status: DC
  Administered 2016-01-27: 10:00:00 via INTRAVENOUS

## 2016-01-27 MED ORDER — ONDANSETRON HCL 4 MG/2ML IJ SOLN
INTRAMUSCULAR | Status: DC | PRN
  Administered 2016-01-27: 4 mg via INTRAVENOUS

## 2016-01-27 MED ORDER — TOBRAMYCIN-DEXAMETHASONE 0.3-0.1 % OP OINT
TOPICAL_OINTMENT | OPHTHALMIC | Status: DC | PRN
  Administered 2016-01-27: 1 via OPHTHALMIC

## 2016-01-27 MED ORDER — PROMETHAZINE HCL 25 MG/ML IJ SOLN
INTRAMUSCULAR | Status: AC
  Filled 2016-01-27: qty 1

## 2016-01-27 MED ORDER — LIDOCAINE 2% (20 MG/ML) 5 ML SYRINGE
INTRAMUSCULAR | Status: DC | PRN
  Administered 2016-01-27: 60 mg via INTRAVENOUS

## 2016-01-27 SURGICAL SUPPLY — 30 items
APPLICATOR COTTON TIP 6IN STRL (MISCELLANEOUS) ×8 IMPLANT
APPLICATOR DR MATTHEWS STRL (MISCELLANEOUS) ×2 IMPLANT
BANDAGE EYE OVAL (MISCELLANEOUS) IMPLANT
CAUTERY EYE LOW TEMP 1300F FIN (OPHTHALMIC RELATED) IMPLANT
COVER BACK TABLE 60X90IN (DRAPES) ×2 IMPLANT
COVER MAYO STAND STRL (DRAPES) ×2 IMPLANT
DRAPE SURG 17X23 STRL (DRAPES) ×4 IMPLANT
DRAPE U-SHAPE 76X120 STRL (DRAPES) ×2 IMPLANT
GLOVE BIO SURGEON STRL SZ 6.5 (GLOVE) ×4 IMPLANT
GLOVE BIOGEL M STRL SZ7.5 (GLOVE) ×4 IMPLANT
GOWN STRL REUS W/ TWL LRG LVL3 (GOWN DISPOSABLE) ×1 IMPLANT
GOWN STRL REUS W/TWL LRG LVL3 (GOWN DISPOSABLE) ×1
GOWN STRL REUS W/TWL XL LVL3 (GOWN DISPOSABLE) ×4 IMPLANT
NS IRRIG 1000ML POUR BTL (IV SOLUTION) ×2 IMPLANT
PACK BASIN DAY SURGERY FS (CUSTOM PROCEDURE TRAY) ×2 IMPLANT
SHEET MEDIUM DRAPE 40X70 STRL (DRAPES) IMPLANT
SLEEVE SCD COMPRESS KNEE MED (MISCELLANEOUS) ×2 IMPLANT
SPEAR EYE SURG WECK-CEL (MISCELLANEOUS) ×8 IMPLANT
STRIP CLOSURE SKIN 1/4X4 (GAUZE/BANDAGES/DRESSINGS) IMPLANT
SUT 6 0 SILK T G140 8DA (SUTURE) ×2 IMPLANT
SUT MERSILENE 6-0 18IN S14 8MM (SUTURE)
SUT PLAIN 6 0 TG1408 (SUTURE) ×2 IMPLANT
SUT SILK 4 0 C 3 735G (SUTURE) IMPLANT
SUT VICRYL 6 0 S 28 (SUTURE) IMPLANT
SUT VICRYL ABS 6-0 S29 18IN (SUTURE) ×2 IMPLANT
SUTURE MERSLN 6-0 18IN S14 8MM (SUTURE) IMPLANT
SYR TB 1ML LL NO SAFETY (SYRINGE) ×2 IMPLANT
SYRINGE 10CC LL (SYRINGE) ×2 IMPLANT
TOWEL OR 17X24 6PK STRL BLUE (TOWEL DISPOSABLE) ×2 IMPLANT
TRAY DSU PREP LF (CUSTOM PROCEDURE TRAY) ×2 IMPLANT

## 2016-01-27 NOTE — Discharge Instructions (Signed)
Diet: Clear liquids, advance to soft foods then regular diet as tolerated by this evening. ° °Pain control:  ° 1)  Ibuprofen 600 mg by mouth every 6-8 hours as needed for pain ° 2)  Ice pack/cold compress to operated eye(s) as desired ° °Eye medications:  Tobradex or Zylet eye ointment 1/2 inch in operated eye(s) twice a day if directed to do so by Dr. Young ° °Activity: No swimming for 1 week.  It is OK to let water run over the face and eyes while showering or taking a bath, even during the first week.  No other restriction on exercise or activity. ° °If there is a patch on one eye, leave it in place until seen in Dr. Young's office this afternoon for suture adjustment.  If there is no patch, you do not need to come to the office this afternoon; just come for your scheduled postop appointment. ° °Call Dr. Young's office [phone] with any problems or concerns. ° ° °Post Anesthesia Home Care Instructions ° °Activity: °Get plenty of rest for the remainder of the day. A responsible adult should stay with you for 24 hours following the procedure.  °For the next 24 hours, DO NOT: °-Drive a car °-Operate machinery °-Drink alcoholic beverages °-Take any medication unless instructed by your physician °-Make any legal decisions or sign important papers. ° °Meals: °Start with liquid foods such as gelatin or soup. Progress to regular foods as tolerated. Avoid greasy, spicy, heavy foods. If nausea and/or vomiting occur, drink only clear liquids until the nausea and/or vomiting subsides. Call your physician if vomiting continues. ° °Special Instructions/Symptoms: °Your throat may feel dry or sore from the anesthesia or the breathing tube placed in your throat during surgery. If this causes discomfort, gargle with warm salt water. The discomfort should disappear within 24 hours. ° °If you had a scopolamine patch placed behind your ear for the management of post- operative nausea and/or vomiting: ° °1. The medication in the  patch is effective for 72 hours, after which it should be removed.  Wrap patch in a tissue and discard in the trash. Wash hands thoroughly with soap and water. °2. You may remove the patch earlier than 72 hours if you experience unpleasant side effects which may include dry mouth, dizziness or visual disturbances. °3. Avoid touching the patch. Wash your hands with soap and water after contact with the patch. °  ° °

## 2016-01-27 NOTE — Anesthesia Postprocedure Evaluation (Signed)
Anesthesia Post Note  Patient: Katherine Diaz  Procedure(s) Performed: Procedure(s) (LRB): REPAIR STRABISMUS (Left)  Patient location during evaluation: PACU Anesthesia Type: General Level of consciousness: awake and alert Pain management: pain level controlled Vital Signs Assessment: post-procedure vital signs reviewed and stable Respiratory status: spontaneous breathing, nonlabored ventilation, respiratory function stable and patient connected to nasal cannula oxygen Cardiovascular status: blood pressure returned to baseline and stable Postop Assessment: no signs of nausea or vomiting Anesthetic complications: no    Last Vitals:  Vitals:   01/27/16 1345 01/27/16 1400  BP: 131/70 120/60  Pulse: 88 88  Resp: 10 11  Temp:      Last Pain:  Vitals:   01/27/16 1415  TempSrc:   PainSc: 5                  Elohim Brune J

## 2016-01-27 NOTE — Anesthesia Procedure Notes (Signed)
Procedure Name: LMA Insertion Performed by: Jaaron Oleson W Pre-anesthesia Checklist: Patient identified, Emergency Drugs available, Suction available and Patient being monitored Patient Re-evaluated:Patient Re-evaluated prior to inductionOxygen Delivery Method: Circle system utilized Preoxygenation: Pre-oxygenation with 100% oxygen Intubation Type: IV induction Ventilation: Mask ventilation without difficulty LMA: LMA flexible inserted LMA Size: 4.0 Number of attempts: 1 Placement Confirmation: positive ETCO2 Tube secured with: Tape Dental Injury: Teeth and Oropharynx as per pre-operative assessment        

## 2016-01-27 NOTE — Anesthesia Preprocedure Evaluation (Signed)
Anesthesia Evaluation  Patient identified by MRN, date of birth, ID band Patient awake and Patient unresponsive    Reviewed: Allergy & Precautions, NPO status , Patient's Chart, lab work & pertinent test results  History of Anesthesia Complications (+) PONV and history of anesthetic complications  Airway Mallampati: II  TM Distance: >3 FB Neck ROM: Full    Dental no notable dental hx.    Pulmonary neg pulmonary ROS, former smoker,    Pulmonary exam normal breath sounds clear to auscultation       Cardiovascular negative cardio ROS Normal cardiovascular exam Rhythm:Regular Rate:Normal     Neuro/Psych  Headaches, PSYCHIATRIC DISORDERS Anxiety Bipolar Disorder    GI/Hepatic Neg liver ROS, GERD  ,  Endo/Other  negative endocrine ROS  Renal/GU negative Renal ROS  negative genitourinary   Musculoskeletal negative musculoskeletal ROS (+)   Abdominal   Peds negative pediatric ROS (+)  Hematology negative hematology ROS (+)   Anesthesia Other Findings   Reproductive/Obstetrics negative OB ROS                             Anesthesia Physical Anesthesia Plan  ASA: II  Anesthesia Plan: General   Post-op Pain Management:    Induction: Intravenous  Airway Management Planned: LMA  Additional Equipment:   Intra-op Plan:   Post-operative Plan: Extubation in OR  Informed Consent: I have reviewed the patients History and Physical, chart, labs and discussed the procedure including the risks, benefits and alternatives for the proposed anesthesia with the patient or authorized representative who has indicated his/her understanding and acceptance.   Dental advisory given  Plan Discussed with: CRNA  Anesthesia Plan Comments:         Anesthesia Quick Evaluation

## 2016-01-27 NOTE — Transfer of Care (Signed)
Immediate Anesthesia Transfer of Care Note  Patient: Katherine Diaz  Procedure(s) Performed: Procedure(s): REPAIR STRABISMUS (Left)  Patient Location: PACU  Anesthesia Type:General  Level of Consciousness: awake and sedated  Airway & Oxygen Therapy: Patient Spontanous Breathing and Patient connected to face mask oxygen  Post-op Assessment: Report given to RN and Post -op Vital signs reviewed and stable  Post vital signs: Reviewed and stable  Last Vitals:  Vitals:   01/27/16 0958 01/27/16 1333  BP: (!) 124/54   Pulse: 60 81  Resp: 18 11  Temp: 36.6 C     Last Pain:  Vitals:   01/27/16 0958  TempSrc: Oral         Complications: No apparent anesthesia complications

## 2016-01-27 NOTE — H&P (View-Only) (Signed)
  Date of examination:  12-28-15  Indication for surgery: to straighten the eyes and allow some binocularity  Pertinent past medical history:  Past Medical History:  Diagnosis Date  . Allergy   . Anxiety   . Bipolar disorder (HCC)   . Complication of anesthesia   . GERD (gastroesophageal reflux disease)   . Heart murmur    pt reports 2 prior echocardiograms, stable as of 10/2015  . Hot flashes   . Hyperlipidemia   . Insomnia   . Menopausal state   . PONV (postoperative nausea and vomiting)   . Wears glasses     Pertinent ocular history:  Strabismus surgery 1968 details unknown.  1990 RMR advance 2 mm, RLR recession 5 mm adjustable, with excision of scar tissue; fat adherence described in op note.  2009 by me:  LLR reces 8.5 adjustable  Pertinent family history:  Family History  Problem Relation Age of Onset  . Heart disease Paternal Grandmother   . Hypertension Paternal Grandmother     General:  Healthy appearing patient in no distress.    Eyes:    Acuity cc OD 20/30  OS 20/25  External: Within normal limits     Anterior segment: Within normal limits x healed conj scars  Motility:   XT=6 with R DHD, total X=16 in primary.  2- LMR.    Fundus: deferred  Refraction: low minus OU  Heart: Regular rate and rhythm without murmur     Lungs: Clear to auscultation     Abdomen: Soft, nontender, normal bowel sounds     Impression:Exotropia/DHD, recurrent  Plan: RLR recess adjustable (vs. LMR advance/resect)  Jaydence Arnesen O 

## 2016-01-27 NOTE — Interval H&P Note (Signed)
History and Physical Interval Note:  01/27/2016 12:03 PM  Katherine Diaz  has presented today for surgery, with the diagnosis of EXOTROPIA  The various methods of treatment have been discussed with the patient and family. After consideration of risks, benefits and other options for treatment, the patient has consented to  Eye muscle surgery left eye as a surgical intervention .  The patient's history has been reviewed, patient examined, no change in status, stable for surgery.  I have reviewed the patient's chart and labs.  Questions were answered to the patient's satisfaction.     Shara BlazingYOUNG,Jerrian Mells O

## 2016-01-27 NOTE — Op Note (Signed)
01/27/2016  1:31 PM  PATIENT:  Katherine Diaz    PRE-OPERATIVE DIAGNOSIS:  EXOTROPIA, consecutive  POST-OPERATIVE DIAGNOSIS:  same  PROCEDURE:  Left medial rectus muscle advancement, 5.0 mm  Surgeon:  Pasty SpillersWilliam O. Hooria Gasparini, MD  Anesthesia:  General (laryngeal mask)  Complications:  None  Description of procedure:  After routine preoperative evaluation, including a discussion of various options for addressing this case of strabismus, taking into account the procedures the patient has had previously, the patient was taken to the operating room where she was identified by me. General anesthesia was induced without difficulty after placement of appropriate monitors. The patient was prepped and draped in standard sterile fashion. A lid speculum was placed in the left eye.  Through an inferonasal fornix incision through conjunctiva and Tenon's fascia, the left medial rectus muscle was engaged on a series of muscle hooks and cleared of its surrounding fascial attachments and extensive scar tissue. The muscle was found inserted 10 mm posterior to the limbus. The tendon, which appeared thin, was secured with a double-armed 6-0 Vicryl suture, with a locking bite at each border of the muscle, 1 mm from the insertion. The muscle was disinserted. Each pole suture was passed posteriorly to internally through the corresponding end of the original muscle stump (approximate 5 mm posterior to the limbus), then anteriorly to posteriorly near the center of the stump, then posteriorly to anteriorly through the center of the muscle  belly, just  posterior to the previously placed suture. The muscle was drawn up to the level of the original insertion, and all slack was removed before the suture ends were tied securely. Conjunctiva was closed with a single 6-0 plain gut suture. Tobradex ophthalmic ointment was placed in the eye. The patient was awakened without difficulty and taken to the recovery room in stable condition,  having suffered no intraoperative or immediate postoperative complications.  Pasty SpillersWilliam O. Maple HudsonYoung, M.D.

## 2016-01-28 LAB — HM PAP SMEAR

## 2016-01-30 ENCOUNTER — Encounter (HOSPITAL_BASED_OUTPATIENT_CLINIC_OR_DEPARTMENT_OTHER): Payer: Self-pay | Admitting: Ophthalmology

## 2017-03-01 ENCOUNTER — Ambulatory Visit (INDEPENDENT_AMBULATORY_CARE_PROVIDER_SITE_OTHER): Payer: BLUE CROSS/BLUE SHIELD | Admitting: Physician Assistant

## 2017-03-01 ENCOUNTER — Telehealth: Payer: Self-pay | Admitting: Physician Assistant

## 2017-03-01 ENCOUNTER — Encounter: Payer: Self-pay | Admitting: Physician Assistant

## 2017-03-01 VITALS — BP 118/68 | HR 60 | Temp 97.5°F | Resp 16 | Ht 66.0 in | Wt 169.0 lb

## 2017-03-01 DIAGNOSIS — F3160 Bipolar disorder, current episode mixed, unspecified: Secondary | ICD-10-CM | POA: Diagnosis not present

## 2017-03-01 DIAGNOSIS — R7303 Prediabetes: Secondary | ICD-10-CM | POA: Diagnosis not present

## 2017-03-01 DIAGNOSIS — E785 Hyperlipidemia, unspecified: Secondary | ICD-10-CM

## 2017-03-01 DIAGNOSIS — Z124 Encounter for screening for malignant neoplasm of cervix: Secondary | ICD-10-CM

## 2017-03-01 DIAGNOSIS — Z2821 Immunization not carried out because of patient refusal: Secondary | ICD-10-CM

## 2017-03-01 DIAGNOSIS — K219 Gastro-esophageal reflux disease without esophagitis: Secondary | ICD-10-CM | POA: Diagnosis not present

## 2017-03-01 DIAGNOSIS — Z0000 Encounter for general adult medical examination without abnormal findings: Secondary | ICD-10-CM | POA: Diagnosis not present

## 2017-03-01 DIAGNOSIS — Z1211 Encounter for screening for malignant neoplasm of colon: Secondary | ICD-10-CM

## 2017-03-01 MED ORDER — EZETIMIBE 10 MG PO TABS: 10.0000 mg | ORAL_TABLET | Freq: Every day | ORAL | 3 refills | Status: DC

## 2017-03-01 MED ORDER — PANTOPRAZOLE SODIUM 40 MG PO TBEC: 40.0000 mg | DELAYED_RELEASE_TABLET | Freq: Every day | ORAL | 1 refills | Status: DC

## 2017-03-01 NOTE — Patient Instructions (Signed)

## 2017-03-01 NOTE — Telephone Encounter (Signed)
Order for cologuard faxed to [facility] °

## 2017-03-01 NOTE — Progress Notes (Signed)
Patient: Katherine Diaz Female    DOB: Jul 29, 1960   56 y.o.   MRN: [id number] Visit Date: 03/01/2017  Today's Provider: Trey Sailors, PA-C   Chief Complaint  Patient presents with  . Establish Care  . Hyperlipidemia  . Gastroesophageal Reflux   Subjective:    Katherine Diaz is a 56 y/o woman presenting here today to establish care. She was most recently seen at [facility].   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 She lives in [place]. She is sexually active, no concerns for STI.  One child: 55, daughter, married to firefighter, two grandsons age 55 and 68.  She sees [facility] where she says she gets yearly PAPs and mammograms. She has never had an abnormal PAP or mammogram.  She has no family history of colon cancer. She has never had a colonoscopy. Interested in pursuing Cologuard rather than colonoscopy.    High cholesterol most recently 204 Currently on zetia - bad reaction to statins, low energy   Takes Pantoprazole 40 mg daily.  Sees Dr. Maryruth Bun in psychiatry for Bipolar I. She is on  lamictal + prozac - see once a month or every two months. Using Trazodone 150 mg QHS for sleep.  She is currently prediabetic. Planning on reducing sugar intake.   Hyperlipidemia  This is a chronic problem. The problem is controlled. Pertinent negatives include no chest pain, leg pain or shortness of breath. The current treatment provides significant improvement of lipids. There are no compliance problems.   Gastroesophageal Reflux  She reports no chest pain, no heartburn, no hoarse voice, no nausea, no sore throat or no wheezing. This is a chronic problem. The problem has been resolved.       Allergies  Allergen Reactions  . Atorvastatin     Statins make her feel like she is hit by train (including crestor, lipitor, livalo, others)  . Codeine Nausea And Vomiting     Current Outpatient Prescriptions:  .  ezetimibe  (ZETIA) 10 MG tablet, Take 1 tablet (10 mg total) by mouth daily., Disp: 90 tablet, Rfl: 3 .  FLUoxetine (PROZAC) 40 MG capsule, fluoxetine 40 mg capsule, Disp: , Rfl:  .  lamoTRIgine (LAMICTAL) 100 MG tablet, lamotrigine 100 mg tablet  TAKE 1 TABLET BY MOUTH EVERY DAY, Disp: , Rfl:  .  lamoTRIgine (LAMICTAL) 200 MG tablet, lamotrigine 200 mg tablet, Disp: , Rfl:  .  pantoprazole (PROTONIX) 40 MG tablet, Take 1 tablet (40 mg total) by mouth daily., Disp: 90 tablet, Rfl: 1 .  tobramycin-dexamethasone (TOBRADEX) ophthalmic ointment, Place 1 application into the left eye 2 (two) times daily., Disp: 3.5 g, Rfl: 0 .  traZODone (DESYREL) 150 MG tablet, Take by mouth., Disp: , Rfl:   Review of Systems  Constitutional: Negative.   HENT: Negative.  Negative for hoarse voice and sore throat.   Eyes: Negative.   Respiratory: Negative.  Negative for shortness of breath and wheezing.   Cardiovascular: Negative.  Negative for chest pain.  Gastrointestinal: Negative.  Negative for heartburn and nausea.  Endocrine: Negative.   Genitourinary: Negative.   Musculoskeletal: Negative.   Skin: Negative.   Allergic/Immunologic: Negative.   Neurological: Negative.   Hematological: Negative.   Psychiatric/Behavioral: Negative.     Social History  Substance Use Topics  . Smoking status: Former Games developer  . Smokeless tobacco: Never Used  . Alcohol use No   Objective:  BP 118/68 (BP Location: Left Arm, Patient Position: Sitting, Cuff Size: Normal)   Pulse 60   Temp (!) 97.5 F (36.4 C) (Oral)   Resp 16   Ht  (1.676 m)   Wt 169 lb (76.7 kg)   BMI 27.28 kg/m  Vitals:   03/01/17 1410  BP: 118/68  Pulse: 60  Resp: 16  Temp: (!) 97.5 F (36.4 C)  TempSrc: Oral  Weight: 169 lb (76.7 kg)  Height:  (1.676 m)     Physical Exam  Constitutional: She is oriented to person, place, and time. She appears well-developed and well-nourished.  HENT:  Right Ear: External ear normal.  Left Ear:  External ear normal.  Mouth/Throat: Oropharynx is clear and moist. No oropharyngeal exudate.  Neck: Neck supple.  Cardiovascular: Normal rate and regular rhythm.   Pulmonary/Chest: Effort normal and breath sounds normal.  Abdominal: Soft. Bowel sounds are normal.  Lymphadenopathy:    She has no cervical adenopathy.  Neurological: She is alert and oriented to person, place, and time.  Skin: Skin is warm and dry.  Psychiatric: She has a normal mood and affect. Her behavior is normal.        Assessment & Plan:     1. Encounter for medical examination to establish care   2. Bipolar affective disorder, current episode mixed, without psychotic features (HCC)  Currently stable on Lamictal and Prozac. Sees Dr. Maryruth Bun.  3. Hyperlipidemia LDL goal <100  - ezetimibe (ZETIA) 10 MG tablet; Take 1 tablet (10 mg total) by mouth daily.  Dispense: 90 tablet; Refill: 3  4. Gastro-esophageal reflux disease without esophagitis  Have counseled on risks of long term use of PPI.   - pantoprazole (PROTONIX) 40 MG tablet; Take 1 tablet (40 mg total) by mouth daily.  Dispense: 90 tablet; Refill: 1  5. Colon cancer screening  Recommend colonoscopy as gold standard, but have ordered Cologuard.   - Cologuard  6. Cervical cancer screening  Requesting records from [facility].  7. Influenza vaccination declined   8. Prediabetes  Needs to work on heart healthy diet, reducing sugar intake, and increasing physical activity.  Return if symptoms worsen or fail to improve.  The entirety of the information documented in the History of Present Illness, Review of Systems and Physical Exam were personally obtained by me. Portions of this information were initially documented by Kavin Leech, CMA and reviewed by me for thoroughness and accuracy.          Trey Sailors, PA-C  [facility]

## 2017-03-07 ENCOUNTER — Encounter: Payer: Self-pay | Admitting: Physician Assistant

## 2017-03-21 ENCOUNTER — Telehealth: Payer: Self-pay | Admitting: Physician Assistant

## 2017-03-21 NOTE — Telephone Encounter (Signed)
ROI ([facility]) [facility].

## 2017-04-22 ENCOUNTER — Telehealth: Payer: Self-pay | Admitting: Physician Assistant

## 2017-04-22 NOTE — Telephone Encounter (Signed)
Left message to call back  

## 2017-04-22 NOTE — Telephone Encounter (Signed)
Please review. Ok to refill?  

## 2017-04-22 NOTE — Telephone Encounter (Signed)
Never evaluated for nausea, nor have I given phenergan. Also, would recommend statin but she seems to be intolerant. There are fibrate classes and bile acid sequestrants but they are generally poorly tolerated and they do not reduce risk of cardiac death like statins. Would request she schedule an office visit so we can discuss further.

## 2017-04-22 NOTE — Telephone Encounter (Signed)
Pt states she has been taking the Rx ezetimibe (ZETIA) 10 MG tablet for 5 month and it is make her have joint pain.  Pt is asking if she can get something different.  [facility].    Pt contacted office for refill request on the following medications:  Promethazine 25MG .  Pt states she uses this for nausea.  [facility].  ZO#[phone]/WJCB#[phone]/MW

## 2017-04-24 NOTE — Telephone Encounter (Addendum)
Actually, I have reviewed my note and there was no mention of hormone replacement therapy. She mentioned that [facility] and mammograms. I do not have this on her medication list and it needs to be updated with appropriate medications and dosing. If it is due to hormone replacement therapy, then [facility] should prescribe this until I can review her records of this treatment.

## 2017-04-24 NOTE — Telephone Encounter (Signed)
Patient feels that she would not benefit from the fibrate medications, so she does not wish to make an office visit. She also states that she has nausea due to being on the hormone replacement therapy and reports that she mentioned this to you. She reports that she usually gets a year worth of refills.

## 2017-10-09 ENCOUNTER — Other Ambulatory Visit: Payer: Self-pay | Admitting: Physician Assistant

## 2017-10-09 DIAGNOSIS — K219 Gastro-esophageal reflux disease without esophagitis: Secondary | ICD-10-CM

## 2017-12-25 ENCOUNTER — Other Ambulatory Visit: Payer: Self-pay | Admitting: Physician Assistant

## 2017-12-25 DIAGNOSIS — K219 Gastro-esophageal reflux disease without esophagitis: Secondary | ICD-10-CM

## 2018-03-07 ENCOUNTER — Other Ambulatory Visit: Payer: Self-pay | Admitting: Physician Assistant

## 2018-03-07 DIAGNOSIS — E785 Hyperlipidemia, unspecified: Secondary | ICD-10-CM

## 2018-03-07 NOTE — Telephone Encounter (Signed)
Needs appt before more refills

## 2018-03-24 ENCOUNTER — Other Ambulatory Visit: Payer: Self-pay | Admitting: Physician Assistant

## 2018-03-24 DIAGNOSIS — K219 Gastro-esophageal reflux disease without esophagitis: Secondary | ICD-10-CM

## 2018-03-24 NOTE — Telephone Encounter (Signed)
LVMTRC.,PC 

## 2018-03-24 NOTE — Telephone Encounter (Signed)
Please schedule appt before more refills. Haven't seen in > 1 year.

## 2018-03-31 ENCOUNTER — Other Ambulatory Visit: Payer: Self-pay | Admitting: Physician Assistant

## 2018-03-31 DIAGNOSIS — E785 Hyperlipidemia, unspecified: Secondary | ICD-10-CM

## 2018-04-13 ENCOUNTER — Other Ambulatory Visit: Payer: Self-pay | Admitting: Physician Assistant

## 2018-04-13 DIAGNOSIS — E785 Hyperlipidemia, unspecified: Secondary | ICD-10-CM

## 2018-04-16 ENCOUNTER — Ambulatory Visit: Payer: BLUE CROSS/BLUE SHIELD | Admitting: Physician Assistant

## 2018-04-16 ENCOUNTER — Encounter: Payer: Self-pay | Admitting: Physician Assistant

## 2018-04-16 VITALS — BP 120/74 | HR 72 | Temp 98.0°F | Resp 16 | Wt 161.8 lb

## 2018-04-16 DIAGNOSIS — Z1211 Encounter for screening for malignant neoplasm of colon: Secondary | ICD-10-CM

## 2018-04-16 DIAGNOSIS — E785 Hyperlipidemia, unspecified: Secondary | ICD-10-CM

## 2018-04-16 DIAGNOSIS — F3160 Bipolar disorder, current episode mixed, unspecified: Secondary | ICD-10-CM | POA: Diagnosis not present

## 2018-04-16 DIAGNOSIS — K219 Gastro-esophageal reflux disease without esophagitis: Secondary | ICD-10-CM

## 2018-04-16 MED ORDER — EZETIMIBE 10 MG PO TABS: 10.0000 mg | ORAL_TABLET | Freq: Every day | ORAL | 3 refills | Status: DC

## 2018-04-16 MED ORDER — PANTOPRAZOLE SODIUM 40 MG PO TBEC: 40.0000 mg | DELAYED_RELEASE_TABLET | Freq: Every day | ORAL | 3 refills | Status: AC

## 2018-04-16 NOTE — Progress Notes (Signed)
Patient: Katherine Diaz Female    DOB: [birth year]   57 y.o.   MRN: [id number] Visit Date: 04/16/2018  Today's Provider: Trey SailorsAdriana M Drakkar Medeiros, PA-C   CC: I need refills  Subjective:    HPI   HLD: Currently takes zetia 10 mg daily. Has been statin intolerant.  Lipid Panel     Component Value Date/Time   CHOL 261 (H) 07/20/2015 1626   TRIG 169 (H) 07/20/2015 1626   HDL 58 07/20/2015 1626   CHOLHDL 4.5 (H) 07/20/2015 1626   LDLCALC 169 (H) 07/20/2015 1626   Followed by psychiatry for bipolar disorder and is currently taking effexor, klnopin and buspar per her report. She also receives trazodone for insomnia. Denies depressive or manic episodes.   Reports she is getting married in several weeks in [place].   Wt Readings from Last 3 Encounters:  04/16/18 161 lb 12.8 oz (73.4 kg)  03/01/17 169 lb (76.7 kg)  01/27/16 167 lb 4 oz (75.9 kg)     Follow up for medications  The patient was last seen for this 1 months ago. Changes made at last visit include no changes.  She reports excellent compliance with treatment. She feels that condition is Unchanged. She is not having side effects.  Needs refill on pantoprazale and Zetia ------------------------------------------------------------------------------------     Allergies  Allergen Reactions  . Atorvastatin     Statins make her feel like she is hit by train (including crestor, lipitor, livalo, others)  . Codeine Nausea And Vomiting     Current Outpatient Medications:  .  busPIRone (BUSPAR) 15 MG tablet, Take 15 mg by mouth 3 (three) times daily., Disp: , Rfl:  .  ezetimibe (ZETIA) 10 MG tablet, TAKE 1 TABLET BY MOUTH EVERY DAY, Disp: 30 tablet, Rfl: 0 .  pantoprazole (PROTONIX) 40 MG tablet, Take 1 tablet (40 mg total) by mouth daily for 14 days. Please schedule office visit before future refills, Disp: 14 tablet, Rfl: 0 .  traZODone (DESYREL) 150 MG tablet, Take by mouth., Disp: , Rfl:  .  venlafaxine XR  (EFFEXOR XR) 150 MG 24 hr capsule, Take 150 mg by mouth daily with breakfast., Disp: , Rfl:  .  FLUoxetine (PROZAC) 40 MG capsule, fluoxetine 40 mg capsule, Disp: , Rfl:  .  lamoTRIgine (LAMICTAL) 100 MG tablet, lamotrigine 100 mg tablet  TAKE 1 TABLET BY MOUTH EVERY DAY, Disp: , Rfl:  .  lamoTRIgine (LAMICTAL) 200 MG tablet, lamotrigine 200 mg tablet, Disp: , Rfl:   Review of Systems  Constitutional: Negative.   Respiratory: Negative.   Cardiovascular: Negative.     Social History   Tobacco Use  . Smoking status: Former Games developermoker  . Smokeless tobacco: Never Used  Substance Use Topics  . Alcohol use: No    Alcohol/week: 0.0 standard drinks   Objective:   BP 120/74 (BP Location: Left Arm, Patient Position: Sitting, Cuff Size: Normal)   Pulse 72   Temp 98 F (36.7 C) (Oral)   Resp 16   Wt 161 lb 12.8 oz (73.4 kg)   SpO2 96%   BMI 26.12 kg/m  Vitals:   04/16/18 1618  BP: 120/74  Pulse: 72  Resp: 16  Temp: 98 F (36.7 C)  TempSrc: Oral  SpO2: 96%  Weight: 161 lb 12.8 oz (73.4 kg)     Physical Exam  Constitutional: She is oriented to person, place, and time. She appears well-developed and well-nourished.  Cardiovascular: Normal rate and regular rhythm.  Pulmonary/Chest: Effort normal and breath sounds normal.  Neurological: She is alert and oriented to person, place, and time.  Skin: Skin is warm and dry.  Psychiatric: She has a normal mood and affect. Her behavior is normal.        Assessment & Plan:     1. Hyperlipidemia LDL goal <100  - Lipid Profile - Comprehensive Metabolic Panel (CMET) - ezetimibe (ZETIA) 10 MG tablet; Take 1 tablet (10 mg total) by mouth daily.  Dispense: 90 tablet; Refill: 3  2. Colon cancer screening  - Cologuard  3. Gastro-esophageal reflux disease without esophagitis  - pantoprazole (PROTONIX) 40 MG tablet; Take 1 tablet (40 mg total) by mouth daily. Please schedule office visit before future refills  Dispense: 90 tablet; Refill:  3  4. Bipolar affective disorder, current episode mixed, without psychotic features (HCC)  Continue follow up with psychiatrist.  Return in about 1 year (around 04/17/2019) for follow up .  The entirety of the information documented in the History of Present Illness, Review of Systems and Physical Exam were personally obtained by me. Portions of this information were initially documented by Rondel Baton, CMA and reviewed by me for thoroughness and accuracy.          Trey Sailors, PA-C  [facility]

## 2018-04-16 NOTE — Patient Instructions (Signed)
Cholesterol Cholesterol is a fat. Your body needs a small amount of cholesterol. Cholesterol (plaque) may build up in your blood vessels (arteries). That makes you more likely to have a heart attack or stroke. You cannot feel your cholesterol level. Having a blood test is the only way to find out if your level is high. Keep your test results. Work with your doctor to keep your cholesterol at a good level. What do the results mean?  Total cholesterol is how much cholesterol is in your blood.  LDL is bad cholesterol. This is the type that can build up. Try to have low LDL.  HDL is good cholesterol. It cleans your blood vessels and carries LDL away. Try to have high HDL.  Triglycerides are fat that the body can store or burn for energy. What are good levels of cholesterol?  Total cholesterol below 200.  LDL below 100 is good for people who have health risks. LDL below 70 is good for people who have very high risks.  HDL above 40 is good. It is best to have HDL of 60 or higher.  Triglycerides below 150. How can I lower my cholesterol? Diet Follow your diet program as told by your doctor.  Choose fish, white meat chicken, or turkey that is roasted or baked. Try not to eat red meat, fried foods, sausage, or lunch meats.  Eat lots of fresh fruits and vegetables.  Choose whole grains, beans, pasta, potatoes, and cereals.  Choose olive oil, corn oil, or canola oil. Only use small amounts.  Try not to eat butter, mayonnaise, shortening, or palm kernel oils.  Try not to eat foods with trans fats.  Choose low-fat or nonfat dairy foods. ? Drink skim or nonfat milk. ? Eat low-fat or nonfat yogurt and cheeses. ? Try not to drink whole milk or cream. ? Try not to eat ice cream, egg yolks, or full-fat cheeses.  Healthy desserts include angel food cake, ginger snaps, animal crackers, hard candy, popsicles, and low-fat or nonfat frozen yogurt. Try not to eat pastries, cakes, pies, and  cookies.  Exercise Follow your exercise program as told by your doctor.  Be more active. Try gardening, walking, and taking the stairs.  Ask your doctor about ways that you can be more active.  Medicine  Take over-the-counter and prescription medicines only as told by your doctor. This information is not intended to replace advice given to you by your health care provider. Make sure you discuss any questions you have with your health care provider. Document Released: 08/17/2008 Document Revised: 12/21/2015 Document Reviewed: 12/01/2015 Elsevier Interactive Patient Education  2018 [employer]  

## 2018-06-24 ENCOUNTER — Other Ambulatory Visit: Payer: Self-pay | Admitting: Physician Assistant

## 2018-06-24 DIAGNOSIS — E785 Hyperlipidemia, unspecified: Secondary | ICD-10-CM

## 2018-06-24 LAB — HIV ANTIBODY (ROUTINE TESTING W REFLEX): HIV: NONREACTIVE

## 2018-06-24 LAB — TSH: TSH: 1.14 (ref <=5.90)

## 2018-06-24 LAB — LIPID PANEL
CHOLESTEROL: 253 — AB (ref 0–200)
HDL: 87 — AB (ref 35–70)
LDL Cholesterol: 138
LDl/HDL Ratio: 1.6
Triglycerides: 142 (ref 40–160)

## 2018-06-24 LAB — HM PAP SMEAR: HM PAP: NEGATIVE

## 2018-06-24 NOTE — Telephone Encounter (Signed)
Pt's OBGYN ran labs on her today.  She is asking if those will be ok to take the place of the one Katherine Diaz wants to have done on her?    Please call pt back to let her know if this will work.  Thanks, [employer]

## 2018-06-27 NOTE — Telephone Encounter (Signed)
Yes it will be fine. Can we request labwork and PAP tet results from OBGYN? She also needs to complete her cologuard for colon cancer screening.

## 2018-06-30 MED ORDER — EZETIMIBE 10 MG PO TABS: 10.0000 mg | ORAL_TABLET | Freq: Every day | ORAL | 3 refills | Status: AC
